# Patient Record
Sex: Female | Born: 1975 | Race: White | Hispanic: No | Marital: Married | Smoking: Former smoker
Health system: Southern US, Community
[De-identification: ages and names within clinical notes are randomized; demographics above are authoritative.]

## PROBLEM LIST (undated history)

## (undated) DIAGNOSIS — C649 Malignant neoplasm of unspecified kidney, except renal pelvis: Secondary | ICD-10-CM

## (undated) DIAGNOSIS — E119 Type 2 diabetes mellitus without complications: Secondary | ICD-10-CM

## (undated) HISTORY — PX: ABDOMINAL HYSTERECTOMY: SHX81

## (undated) HISTORY — PX: CHOLECYSTECTOMY: SHX55

## (undated) HISTORY — PX: BREAST CYST ASPIRATION: SHX578

## (undated) HISTORY — PX: TONSILLECTOMY: SUR1361

## (undated) HISTORY — PX: NEPHRECTOMY: SHX65

---

## 2012-05-04 ENCOUNTER — Emergency Department (HOSPITAL_COMMUNITY)
Admission: EM | Admit: 2012-05-04 | Discharge: 2012-05-04 | Disposition: A | Discharge status: Home / Self Care | Payer: Self-pay | Attending: Emergency Medicine | Admitting: Emergency Medicine

## 2012-05-04 ENCOUNTER — Encounter (HOSPITAL_COMMUNITY): Payer: Self-pay | Admitting: Emergency Medicine

## 2012-05-04 ENCOUNTER — Emergency Department (HOSPITAL_COMMUNITY): Payer: Self-pay

## 2012-05-04 DIAGNOSIS — Z79899 Other long term (current) drug therapy: Secondary | ICD-10-CM | POA: Insufficient documentation

## 2012-05-04 DIAGNOSIS — R6883 Chills (without fever): Secondary | ICD-10-CM | POA: Insufficient documentation

## 2012-05-04 DIAGNOSIS — K5900 Constipation, unspecified: Secondary | ICD-10-CM | POA: Insufficient documentation

## 2012-05-04 DIAGNOSIS — R630 Anorexia: Secondary | ICD-10-CM | POA: Insufficient documentation

## 2012-05-04 DIAGNOSIS — Z8719 Personal history of other diseases of the digestive system: Secondary | ICD-10-CM | POA: Insufficient documentation

## 2012-05-04 DIAGNOSIS — R1011 Right upper quadrant pain: Secondary | ICD-10-CM | POA: Insufficient documentation

## 2012-05-04 DIAGNOSIS — M549 Dorsalgia, unspecified: Secondary | ICD-10-CM | POA: Insufficient documentation

## 2012-05-04 DIAGNOSIS — R109 Unspecified abdominal pain: Secondary | ICD-10-CM

## 2012-05-04 DIAGNOSIS — Z85528 Personal history of other malignant neoplasm of kidney: Secondary | ICD-10-CM | POA: Insufficient documentation

## 2012-05-04 DIAGNOSIS — Z90710 Acquired absence of both cervix and uterus: Secondary | ICD-10-CM | POA: Insufficient documentation

## 2012-05-04 DIAGNOSIS — E119 Type 2 diabetes mellitus without complications: Secondary | ICD-10-CM | POA: Insufficient documentation

## 2012-05-04 DIAGNOSIS — R110 Nausea: Secondary | ICD-10-CM | POA: Insufficient documentation

## 2012-05-04 DIAGNOSIS — Z905 Acquired absence of kidney: Secondary | ICD-10-CM | POA: Insufficient documentation

## 2012-05-04 HISTORY — DX: Malignant neoplasm of unspecified kidney, except renal pelvis: C64.9

## 2012-05-04 HISTORY — DX: Type 2 diabetes mellitus without complications: E11.9

## 2012-05-04 LAB — CBC WITH DIFFERENTIAL/PLATELET
Eosinophils Relative: 1 % (ref 0–5)
HCT: 38 % (ref 36.0–46.0)
Hemoglobin: 13 g/dL (ref 12.0–15.0)
Lymphocytes Relative: 34 % (ref 12–46)
Lymphs Abs: 2.8 10*3/uL (ref 0.7–4.0)
MCH: 27.4 pg (ref 26.0–34.0)
MCV: 80.2 fL (ref 78.0–100.0)
Monocytes Absolute: 0.7 10*3/uL (ref 0.1–1.0)
Monocytes Relative: 8 % (ref 3–12)
Platelets: 317 10*3/uL (ref 150–400)
RBC: 4.74 MIL/uL (ref 3.87–5.11)
WBC: 8.2 10*3/uL (ref 4.0–10.5)

## 2012-05-04 LAB — LIPASE, BLOOD: Lipase: 53 U/L (ref 11–59)

## 2012-05-04 LAB — URINALYSIS, ROUTINE W REFLEX MICROSCOPIC
Bilirubin Urine: NEGATIVE
Hgb urine dipstick: NEGATIVE
Protein, ur: NEGATIVE mg/dL
Urobilinogen, UA: 0.2 mg/dL (ref 0.0–1.0)

## 2012-05-04 LAB — COMPREHENSIVE METABOLIC PANEL
ALT: 19 U/L (ref 0–35)
BUN: 14 mg/dL (ref 6–23)
CO2: 23 mEq/L (ref 19–32)
Calcium: 9.6 mg/dL (ref 8.4–10.5)
GFR calc Af Amer: >90 mL/min (ref >90)
GFR calc non Af Amer: >90 mL/min (ref >90)
Glucose, Bld: 116 mg/dL — ABNORMAL HIGH (ref 70–99)
Sodium: 137 mEq/L (ref 135–145)

## 2012-05-04 LAB — D-DIMER, QUANTITATIVE: D-Dimer, Quant: <0.27 ug/mL-FEU (ref 0.00–0.48)

## 2012-05-04 MED ORDER — IOHEXOL 300 MG/ML  SOLN
100.0000 mL | Freq: Once | INTRAMUSCULAR | Status: AC | PRN
  Administered 2012-05-04: 100 mL via INTRAVENOUS

## 2012-05-04 MED ORDER — POLYETHYLENE GLYCOL 3350 17 G PO PACK: 17.0000 g | PACK | Freq: Every day | ORAL | Status: DC

## 2012-05-04 MED ORDER — IOHEXOL 300 MG/ML  SOLN
25.0000 mL | INTRAMUSCULAR | Status: AC
  Administered 2012-05-04: 25 mL via ORAL

## 2012-05-04 MED ORDER — SODIUM CHLORIDE 0.9 % IV SOLN: Freq: Once | INTRAVENOUS | Status: DC

## 2012-05-04 MED ORDER — ONDANSETRON HCL 4 MG PO TABS: 4.0000 mg | ORAL_TABLET | Freq: Four times a day (QID) | ORAL | Status: DC

## 2012-05-04 MED ORDER — SODIUM CHLORIDE 0.9 % IV BOLUS (SEPSIS)
1000.0000 mL | Freq: Once | INTRAVENOUS | Status: AC
  Administered 2012-05-04: 1000 mL via INTRAVENOUS

## 2012-05-04 MED ORDER — MORPHINE SULFATE 4 MG/ML IJ SOLN
4.0000 mg | Freq: Once | INTRAMUSCULAR | Status: AC
  Administered 2012-05-04: 4 mg via INTRAVENOUS
  Filled 2012-05-04: qty 1

## 2012-05-04 MED ORDER — ONDANSETRON HCL 4 MG/2ML IJ SOLN
4.0000 mg | Freq: Once | INTRAMUSCULAR | Status: AC
  Administered 2012-05-04: 4 mg via INTRAVENOUS
  Filled 2012-05-04: qty 2

## 2012-05-04 NOTE — ED Notes (Signed)
Pt undressed, in gown; family at bedside

## 2012-05-04 NOTE — ED Provider Notes (Signed)
History     CSN: [id number]  Arrival date & time 05/04/12  4098   First MD Initiated Contact with Patient 05/04/12 0740      Chief Complaint  Patient presents with  . Abdominal Pain    (Consider location/radiation/quality/duration/timing/severity/associated sxs/prior treatment) HPI Comments: Patient presents with right sided abdominal pain that started last night. She has had it intermittently past several months but getting worse over the past day.  The pain is in her right upper quadrant and worse with palpation and worse with eating. It radiates to her right back. There is associated nausea and chills. She denies vomiting, fever, dysuria, hematuria, vaginal bleeding or discharge. She was told about 6 months ago that she had an inflamed gallbladder on CT scan. She has a history of renal cancer status post nephrectomy.   The history is provided by the patient.    Past Medical History  Diagnosis Date  . Renal cancer   . Diabetes mellitus without complication     Past Surgical History  Procedure Laterality Date  . Nephrectomy    . Abdominal hysterectomy      No family history on file.  History  Substance Use Topics  . Smoking status: Never Smoker   . Smokeless tobacco: Not on file  . Alcohol Use: No    OB History   Grav Para Term Preterm Abortions TAB SAB Ect Mult Living                  Review of Systems  Constitutional: Positive for chills, activity change and appetite change. Negative for fever and fatigue.  HENT: Negative for rhinorrhea.   Respiratory: Negative for cough, chest tightness and shortness of breath.   Cardiovascular: Negative for chest pain.  Gastrointestinal: Positive for nausea and abdominal pain. Negative for vomiting and diarrhea.  Genitourinary: Negative for dysuria, hematuria, vaginal bleeding and vaginal discharge.  Musculoskeletal: Positive for back pain.  Skin: Negative for rash.  Neurological: Negative for dizziness, weakness and  headaches.  A complete 10 system review of systems was obtained and all systems are negative except as noted in the HPI and PMH.    Allergies  Ibuprofen; Penicillins; and Sulfa antibiotics  Home Medications   Current Outpatient Rx  Name  Route  Sig  Dispense  Refill  . estradiol (ESTRACE) 1 MG tablet   Oral   Take 1 mg by mouth daily.         Marland Kitchen gabapentin (NEURONTIN) 100 MG capsule   Oral   Take 100 mg by mouth 3 (three) times daily.         Marland Kitchen glimepiride (AMARYL) 4 MG tablet   Oral   Take 4 mg by mouth daily before breakfast.         . metFORMIN (GLUCOPHAGE) 500 MG tablet   Oral   Take 500 mg by mouth 2 (two) times daily with a meal.         . metoprolol (LOPRESSOR) 100 MG tablet   Oral   Take 100 mg by mouth 2 (two) times daily.         . Naproxen Sodium (ALEVE PO)   Oral   Take 1-2 tablets by mouth every 12 (twelve) hours as needed (headache or pain).         . ondansetron (ZOFRAN) 4 MG tablet   Oral   Take 1 tablet (4 mg total) by mouth every 6 (six) hours.   12 tablet   0   .  polyethylene glycol (MIRALAX) packet   Oral   Take 17 g by mouth daily.   14 each   0     BP 134/79  Pulse 89  Temp(Src) 97.9 F (36.6 C) (Oral)  Resp 19  SpO2 98%  Physical Exam  Constitutional: She is oriented to person, place, and time. She appears well-developed and well-nourished. No distress.  HENT:  Head: Normocephalic and atraumatic.  Mouth/Throat: Oropharynx is clear and moist. No oropharyngeal exudate.  Eyes: Conjunctivae and EOM are normal. Pupils are equal, round, and reactive to light.  Neck: Normal range of motion. Neck supple.  Cardiovascular: Normal rate, regular rhythm and normal heart sounds.   Pulmonary/Chest: Effort normal and breath sounds normal. No respiratory distress.  Abdominal: Soft. There is tenderness. There is guarding. There is no rebound.  Right upper quadrant tenderness with guarding.  Musculoskeletal: Normal range of motion. She  exhibits tenderness. She exhibits no edema.  Right flank tenderness, well healed surgical incision  Neurological: She is alert and oriented to person, place, and time. No cranial nerve deficit. She exhibits normal muscle tone. Coordination normal.  Skin: Skin is warm.    ED Course  Procedures (including critical care time)  Labs Reviewed  COMPREHENSIVE METABOLIC PANEL - Abnormal; Notable for the following:    Glucose, Bld 116 (*)    All other components within normal limits  CBC WITH DIFFERENTIAL  LIPASE, BLOOD  URINALYSIS, ROUTINE W REFLEX MICROSCOPIC  D-DIMER, QUANTITATIVE   Dg Chest 2 View  05/04/2012  *RADIOLOGY REPORT*  Clinical Data: Right-sided back pain.  CHEST - 2 VIEW  Comparison: No priors.  Findings: Lung volumes are normal.  No consolidative airspace disease.  No pleural effusions.  No pneumothorax.  No pulmonary nodule or mass noted.  Pulmonary vasculature and the cardiomediastinal silhouette are within normal limits.  IMPRESSION: 1. No radiographic evidence of acute cardiopulmonary disease.   Original Report Authenticated By: Trudie Reed, M.D.    US Abdomen Complete  05/04/2012  *RADIOLOGY REPORT*  Clinical Data:  Right upper quadrant pain.  COMPLETE ABDOMINAL ULTRASOUND  Comparison:  12/28/2011  Findings:  Gallbladder:  No stones or wall thickening.  Negative sonographic Murphy's.  Common bile duct:   Upper limits normal in diameter at 7 mm.  This is stable when compared to prior CT.  Liver:  Increased echotexture compatible with fatty infiltration. No focal abnormality.  No biliary ductal dilatation.  IVC:  Appears normal.  Pancreas:  No focal abnormality seen.  Spleen:  Within normal limits in size and echotexture.  Right Kidney:   Prior right nephrectomy.  Left Kidney:  Mild fullness of the left renal pelvis.  No focal abnormality.  Normal echotexture.  Abdominal aorta:  No aneurysm identified.  IMPRESSION: Prior right nephrectomy.  Slight fullness of the left renal  collecting system.  Increased echotexture throughout the liver compatible with fatty infiltration.   Original Report Authenticated By: Charlett Nose, M.D.    Ct Abdomen Pelvis W Contrast  05/04/2012  *RADIOLOGY REPORT*  Clinical Data: Right upper quadrant pain.  History of renal cell cancer and right nephrectomy.  CT ABDOMEN AND PELVIS WITH CONTRAST  Technique:  Multidetector CT imaging of the abdomen and pelvis was performed following the standard protocol during bolus administration of intravenous contrast.  Contrast: OMNIPAQUE IOHEXOL 300 MG/ML  SOLN  Comparison: 12/28/2011  Findings: Heart is mildly enlarged.  Lung bases are clear.  No effusions.  Diffuse fatty infiltration of the liver with more pronounced focal fatty  infiltration adjacent to the falciform ligament.  No focal abnormality otherwise.  No biliary ductal dilatation.  Gallbladder, stomach, spleen, pancreas, adrenals are unremarkable.  Prior right nephrectomy.  Punctate nonobstructing stone in the lower pole of the left kidney.  No hydronephrosis or focal renal abnormality.  Moderate stool burden throughout the colon.  Small bowel is decompressed.  Appendix is visualized and is normal.  Prior hysterectomy.  No adnexal masses, free fluid or free air.  No adenopathy.  Urinary bladder is grossly unremarkable.  Aorta and iliac vessels are normal caliber.  No acute bony abnormality.  IMPRESSION: Fatty infiltration of the liver.  Heart appears borderline enlarged.  Prior right nephrectomy.  Moderate stool throughout the colon.   Original Report Authenticated By: Charlett Nose, M.D.      1. Abdominal pain   2. Constipation       MDM  Right upper quadrant pain with nausea and chills. Tenderness on exam. We'll obtain labs, ultrasound gallbladder. Treat symptoms.  Urinalysis negative. No white count. LFTs and lipase normal. Gallbladder appears normal on ultrasound. We'll obtain CT imaging to investigate further.  CT scan shows no acute  pathology. Moderate stool. Patient's workup is unremarkable and she is tolerating by mouth. her pain is controlled. Instructed to followup with her transplant surgeon regarding her ongoing pain.   Glynn Octave, MD 05/04/12 [phone]

## 2012-05-04 NOTE — ED Notes (Addendum)
Pt c/o RUQ pain that radiates into back over the last week. Pt started with nausea this AM and increased pain. Pt DM2 and takes oral medications for this, PMH of renal cancer with right nephrectomy. NAD at this time, speaking full sentences

## 2012-05-04 NOTE — ED Notes (Signed)
Pt is drinking contrast at this time, 1 cup down and 1 to go

## 2015-01-29 IMAGING — US US ABDOMEN COMPLETE
1 series · 14 of 25 positions shown · non-contrast
Comparison: 12/28/2011

CLINICAL DATA: Right upper quadrant pain.

COMPLETE ABDOMINAL ULTRASOUND

[Series 1: us abdomen complete · 0.35mm/px · 14 of 57 slices shown]
[im 1/57]
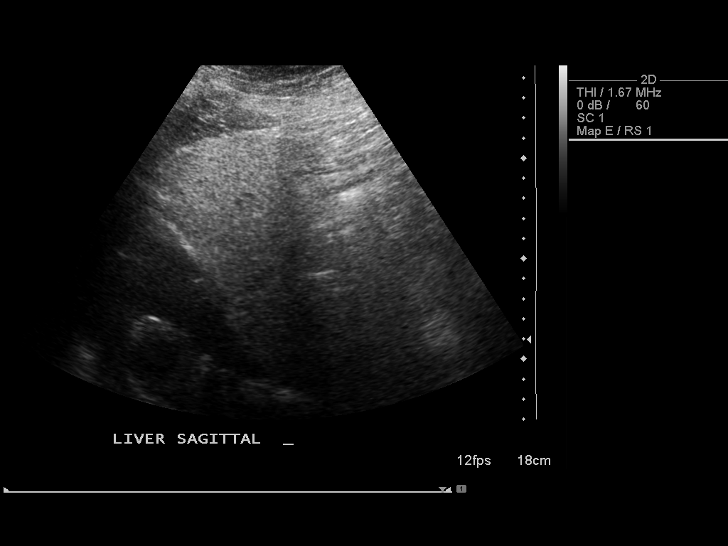
[im 5/57]
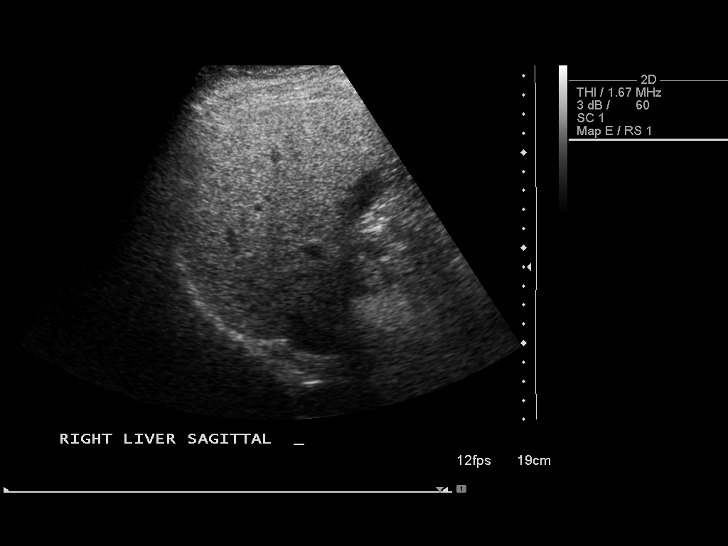
[im 10/57]
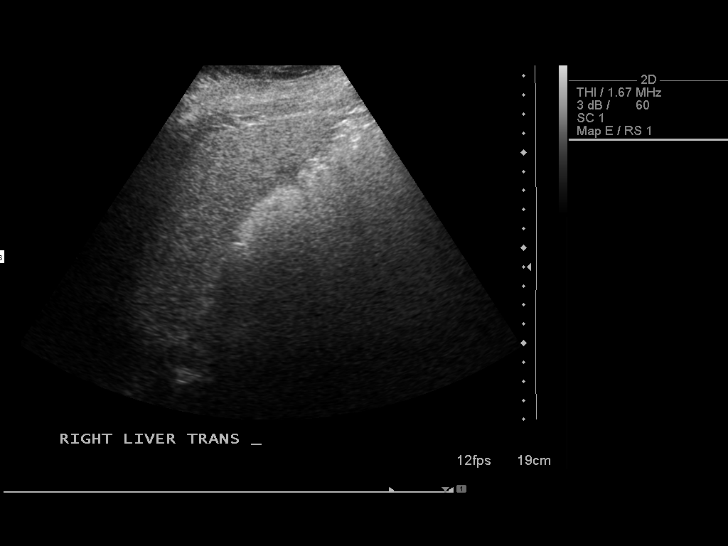
[im 15/57]
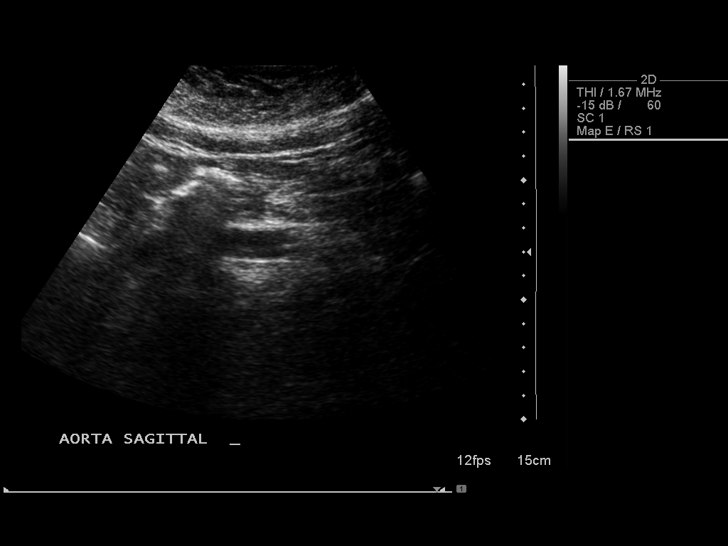
[im 19/57]
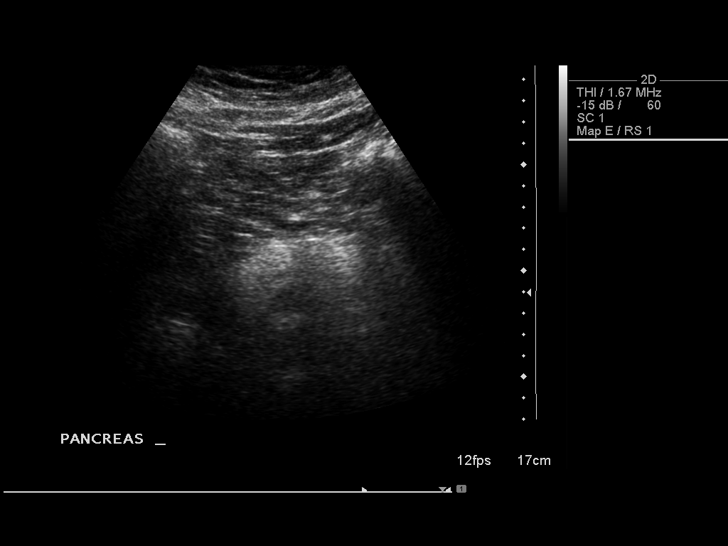
[im 22/57]
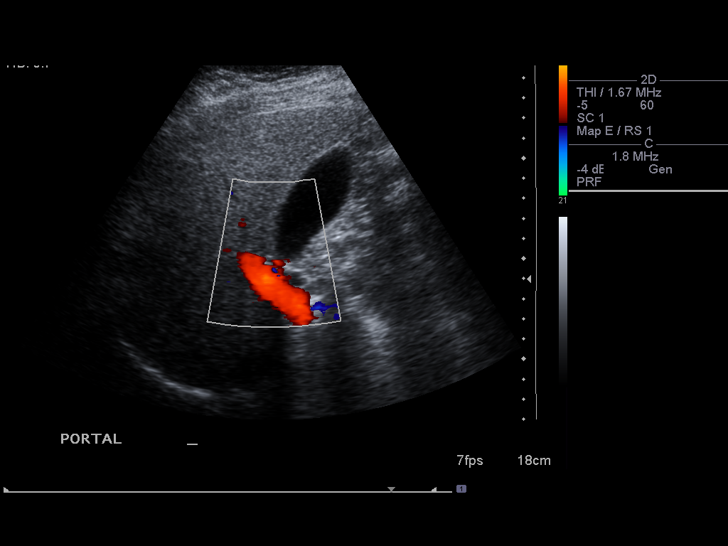
[im 26/57]
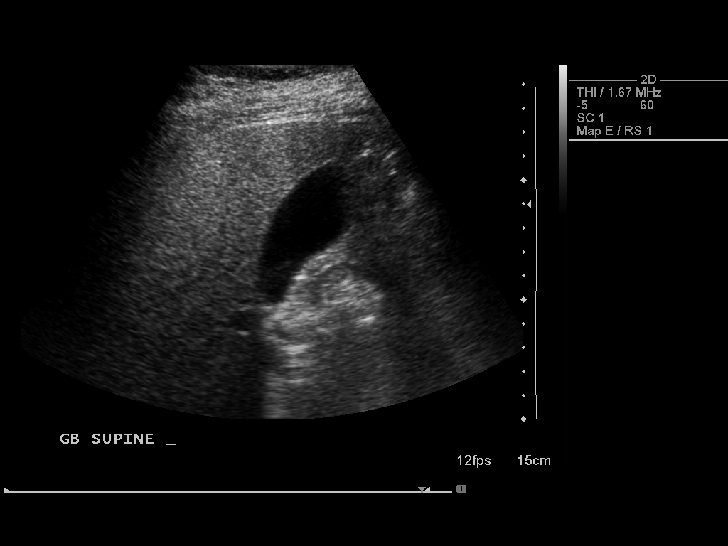
[im 31/57]
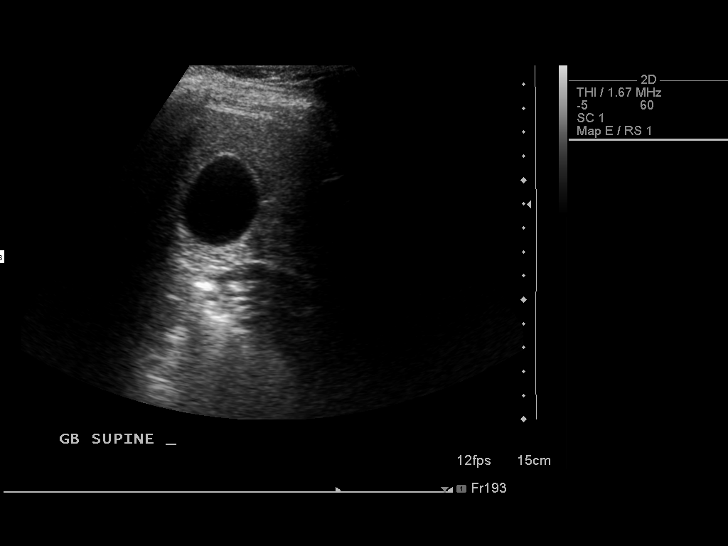
[im 36/57]
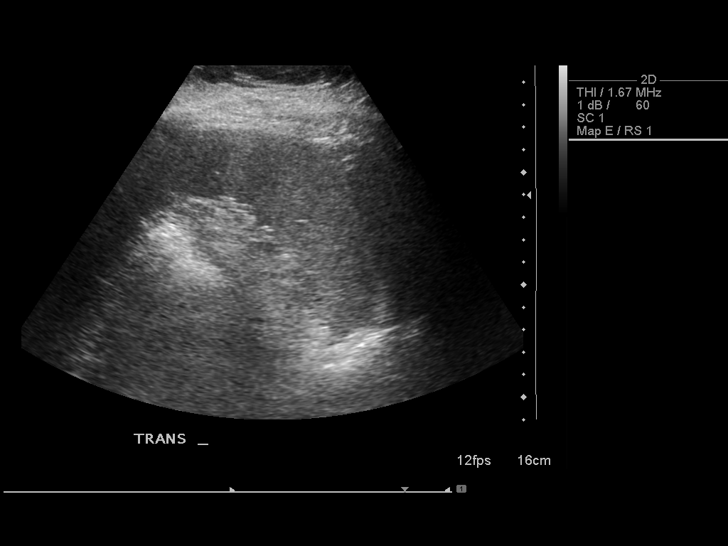
[im 38/57]
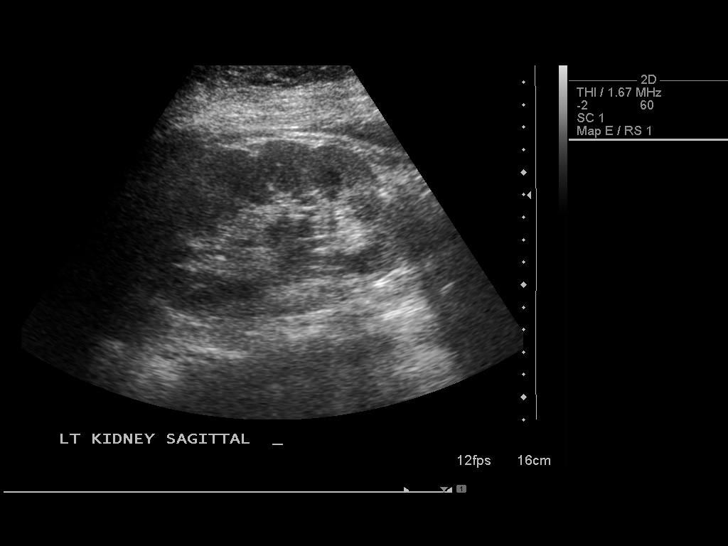
[im 43/57]
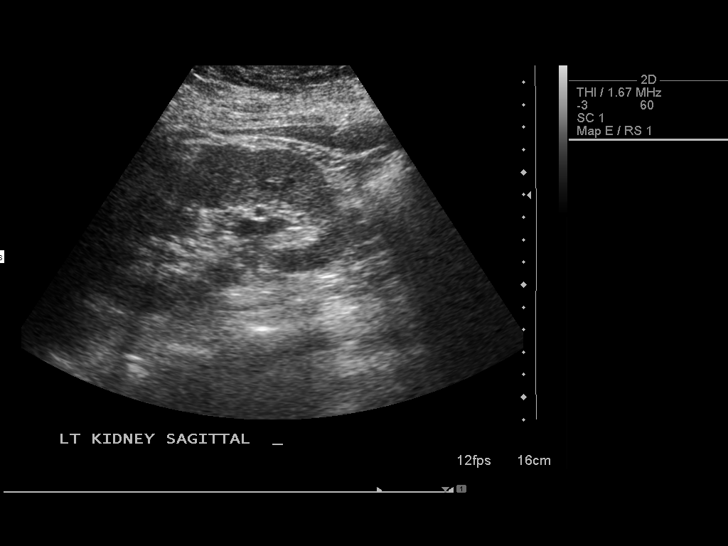
[im 47/57]
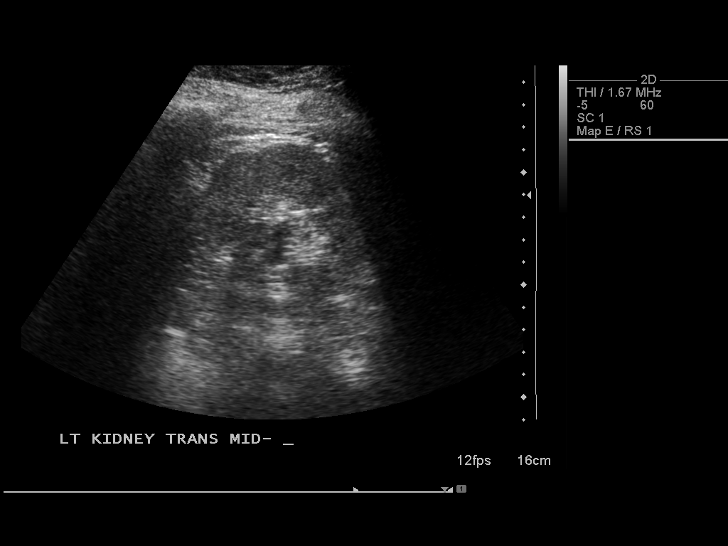
[im 52/57]
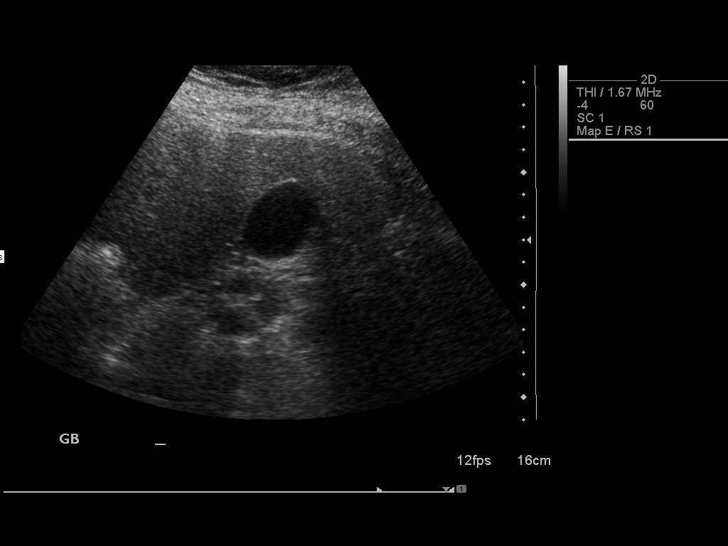
[im 57/57]
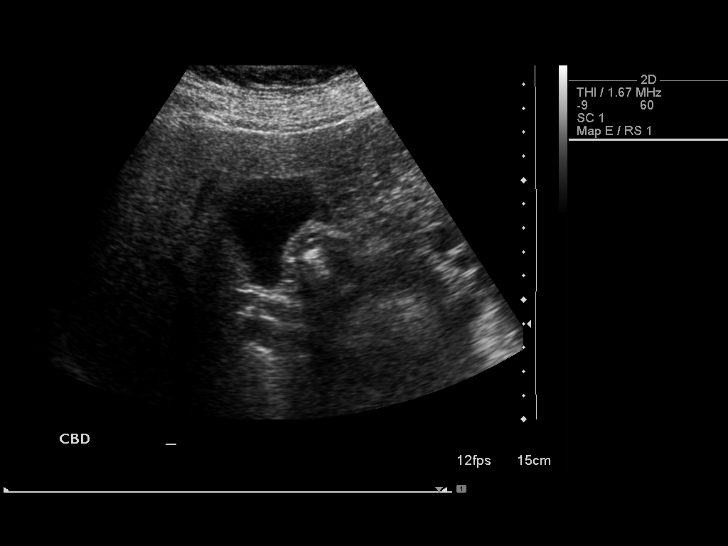

[14 of 25 positions shown; findings below may reference images not displayed]

FINDINGS: Gallbladder:  No stones or wall thickening.  Negative sonographic
Matthei.

Common bile duct:   Upper limits normal in diameter at 7 mm.  This
is stable when compared to prior CT.

Liver:  Increased echotexture compatible with fatty infiltration.
No focal abnormality.  No biliary ductal dilatation.

IVC:  Appears normal.

Pancreas:  No focal abnormality seen.

Spleen:  Within normal limits in size and echotexture.

Right Kidney:   Prior right nephrectomy.

Left Kidney:  Mild fullness of the left renal pelvis.  No focal
abnormality.  Normal echotexture.

Abdominal aorta:  No aneurysm identified.
IMPRESSION: Prior right nephrectomy.  Slight fullness of the left renal
collecting system.

Increased echotexture throughout the liver compatible with fatty
infiltration.

## 2015-01-29 IMAGING — CT CT ABD-PELV W/ CM
2 of 4 series · 13 of 32 positions shown, 18 images · IV contrast (omnipaque)
Comparison: 12/28/2011

CLINICAL DATA: Right upper quadrant pain.  History of renal cell
cancer and right nephrectomy.

CT ABDOMEN AND PELVIS WITH CONTRAST
TECHNIQUE: Multidetector CT imaging of the abdomen and pelvis was
performed following the standard protocol during bolus
administration of intravenous contrast.
Contrast: 100mL OMNIPAQUE IOHEXOL 300 MG/ML  SOLN

[Series 2: routine abdomen · axial · 0.76mm/px · z∈[-428,-103]mm · 5 of 97 slices shown, 10 images]
[im 17/97  soft-tissue]
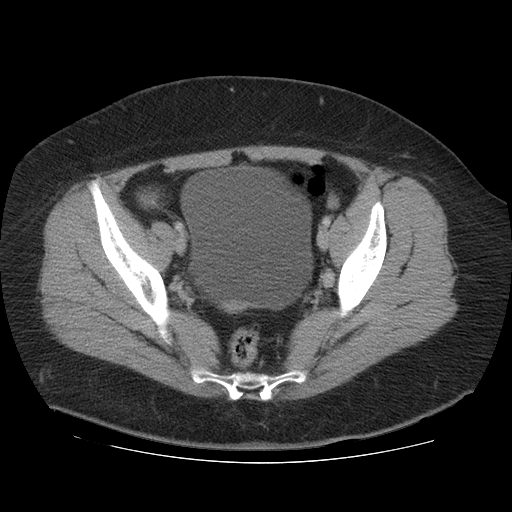
[im 17/97  bone]
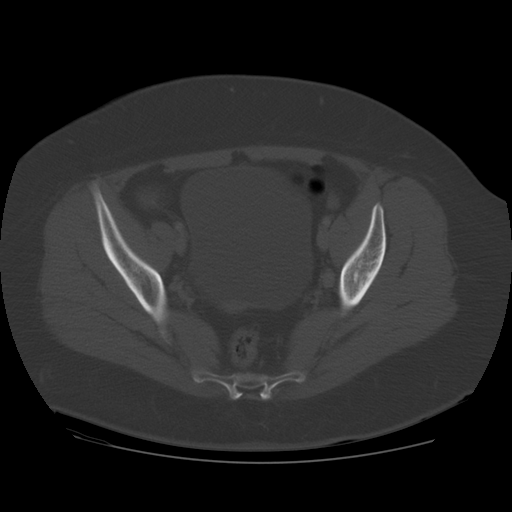
[im 33/97  soft-tissue]
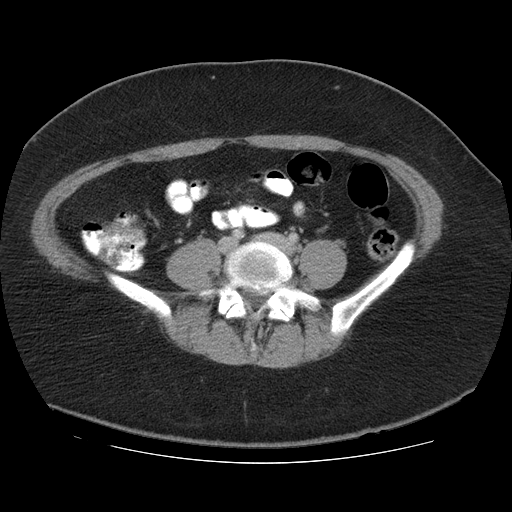
[im 33/97  lung]
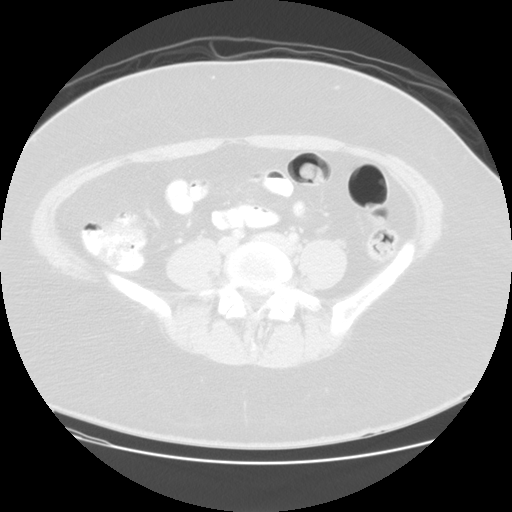
[im 49/97  soft-tissue]
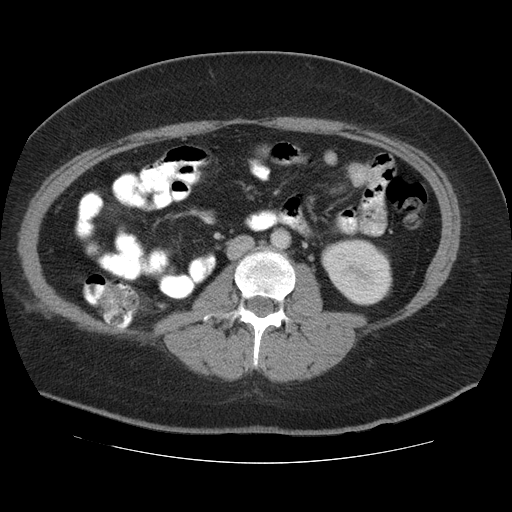
[im 49/97  lung]
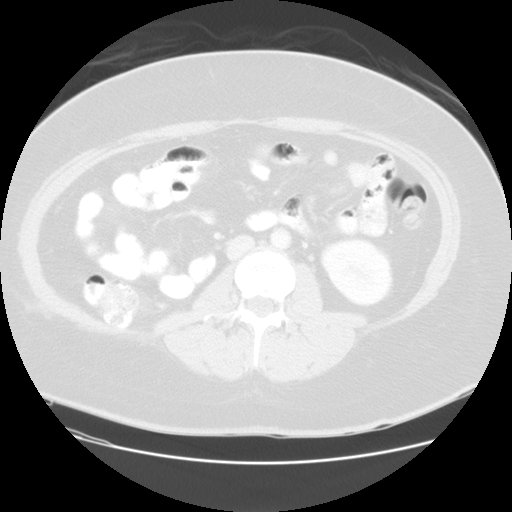
[im 65/97  soft-tissue]
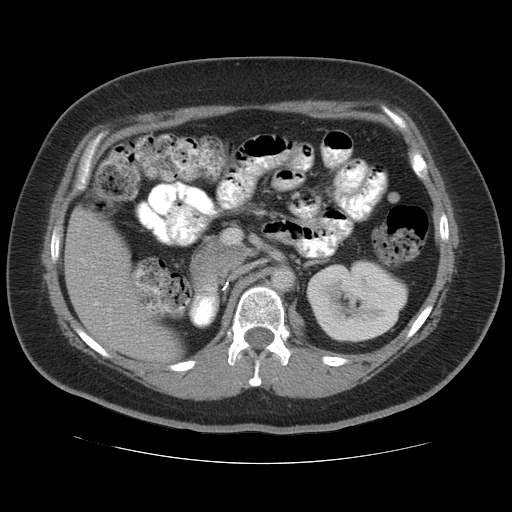
[im 65/97  lung]
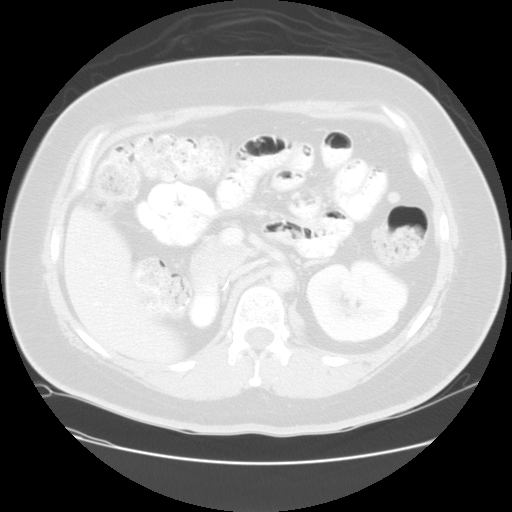
[im 81/97  soft-tissue]
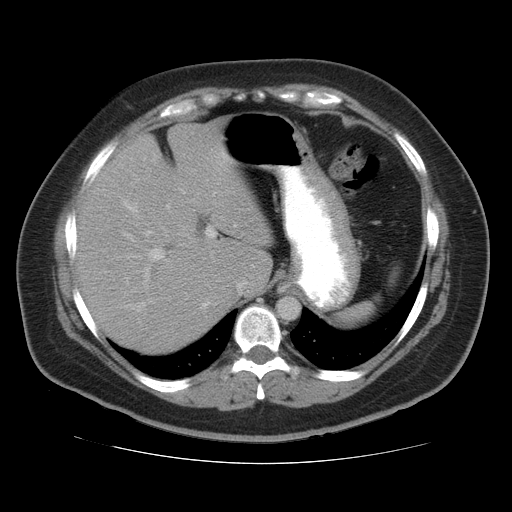
[im 81/97  lung]
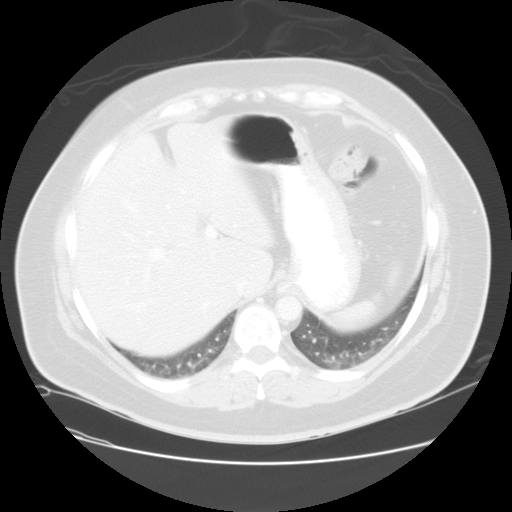

[Series 401: sagittals · sagittal · 1.04mm/px · 8 of 186 slices shown]
[im 16/186  soft-tissue]
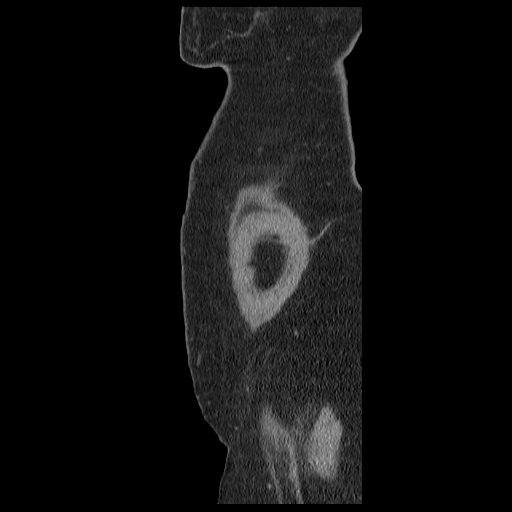
[im 47/186  soft-tissue]
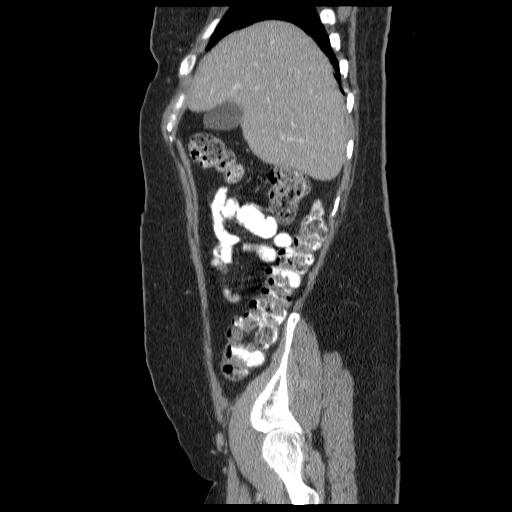
[im 62/186  soft-tissue]
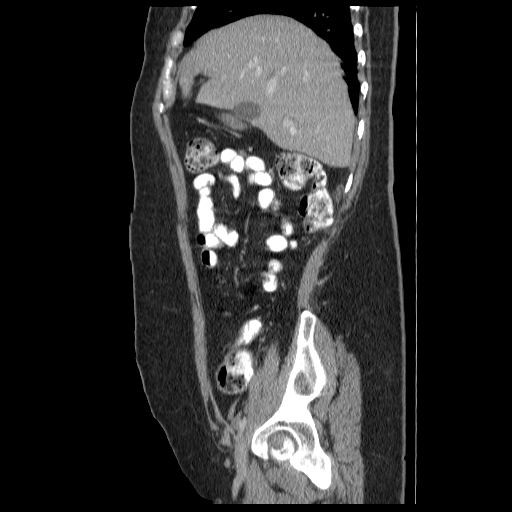
[im 78/186  soft-tissue]
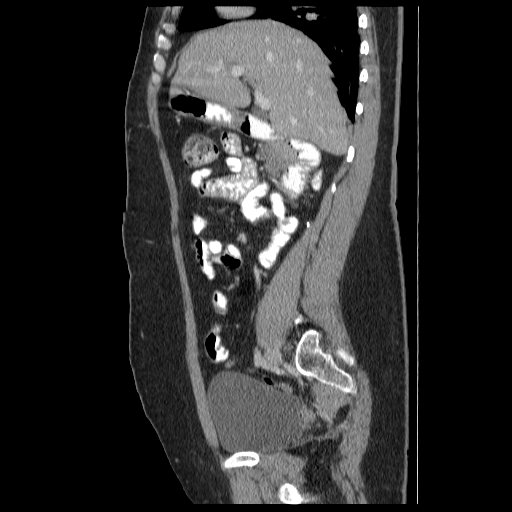
[im 108/186  soft-tissue]
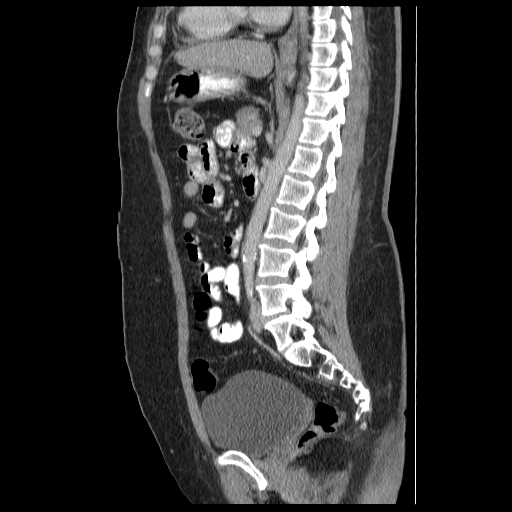
[im 124/186  soft-tissue]
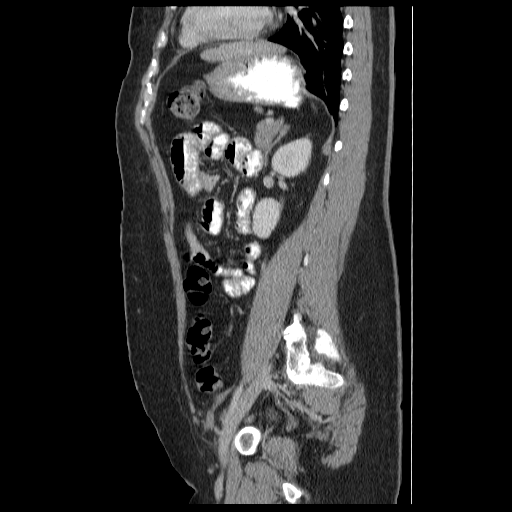
[im 139/186  soft-tissue]
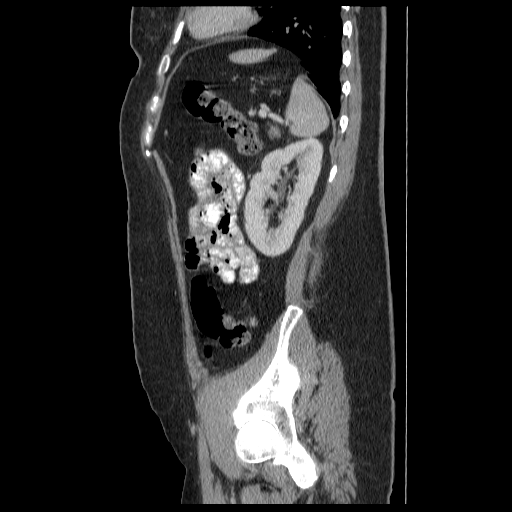
[im 170/186  soft-tissue]
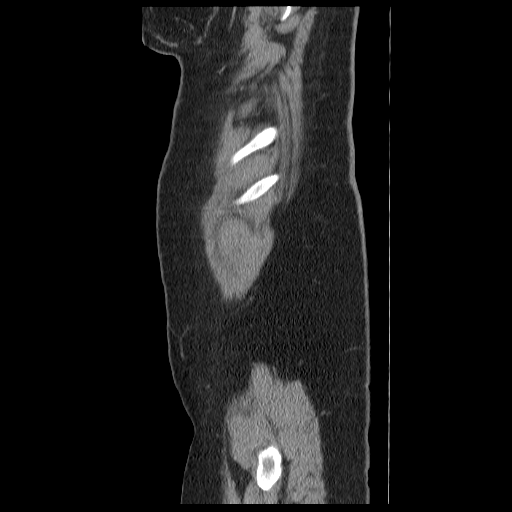

[13 of 32 positions shown; findings below may reference images not displayed]

FINDINGS: Heart is mildly enlarged.  Lung bases are clear.  No
effusions.

Diffuse fatty infiltration of the liver with more pronounced focal
fatty infiltration adjacent to the falciform ligament.  No focal
abnormality otherwise.  No biliary ductal dilatation.  Gallbladder,
stomach, spleen, pancreas, adrenals are unremarkable.  Prior right
nephrectomy.  Punctate nonobstructing stone in the lower pole of
the left kidney.  No hydronephrosis or focal renal abnormality.

Moderate stool burden throughout the colon.  Small bowel is
decompressed.  Appendix is visualized and is normal.

Prior hysterectomy.  No adnexal masses, free fluid or free air.  No
adenopathy.  Urinary bladder is grossly unremarkable.

Aorta and iliac vessels are normal caliber.  No acute bony
abnormality.
IMPRESSION: Fatty infiltration of the liver.

Heart appears borderline enlarged.

Prior right nephrectomy.

Moderate stool throughout the colon.

## 2016-07-01 DIAGNOSIS — L723 Sebaceous cyst: Secondary | ICD-10-CM

## 2016-07-01 HISTORY — DX: Sebaceous cyst: L72.3

## 2018-02-20 DIAGNOSIS — M722 Plantar fascial fibromatosis: Secondary | ICD-10-CM

## 2018-02-20 HISTORY — DX: Plantar fascial fibromatosis: M72.2

## 2020-12-02 ENCOUNTER — Encounter: Payer: Self-pay | Admitting: Physician Assistant

## 2020-12-02 ENCOUNTER — Other Ambulatory Visit: Payer: Self-pay

## 2020-12-02 ENCOUNTER — Ambulatory Visit: Payer: BC Managed Care – PPO | Admitting: Physician Assistant

## 2020-12-02 VITALS — BP 138/76 | HR 88 | Temp 98.6°F | Resp 18 | Ht 64.0 in | Wt 222.0 lb

## 2020-12-02 DIAGNOSIS — M25551 Pain in right hip: Secondary | ICD-10-CM

## 2020-12-02 DIAGNOSIS — E1029 Type 1 diabetes mellitus with other diabetic kidney complication: Secondary | ICD-10-CM

## 2020-12-02 DIAGNOSIS — K219 Gastro-esophageal reflux disease without esophagitis: Secondary | ICD-10-CM

## 2020-12-02 DIAGNOSIS — Z85528 Personal history of other malignant neoplasm of kidney: Secondary | ICD-10-CM

## 2020-12-02 DIAGNOSIS — G2581 Restless legs syndrome: Secondary | ICD-10-CM

## 2020-12-02 DIAGNOSIS — I10 Essential (primary) hypertension: Secondary | ICD-10-CM

## 2020-12-02 DIAGNOSIS — G8929 Other chronic pain: Secondary | ICD-10-CM

## 2020-12-02 DIAGNOSIS — Z1211 Encounter for screening for malignant neoplasm of colon: Secondary | ICD-10-CM

## 2020-12-02 DIAGNOSIS — E109 Type 1 diabetes mellitus without complications: Secondary | ICD-10-CM | POA: Insufficient documentation

## 2020-12-02 HISTORY — DX: Essential (primary) hypertension: I10

## 2020-12-02 HISTORY — DX: Gastro-esophageal reflux disease without esophagitis: K21.9

## 2020-12-02 HISTORY — DX: Personal history of other malignant neoplasm of kidney: Z85.528

## 2020-12-02 HISTORY — DX: Other chronic pain: G89.29

## 2020-12-02 HISTORY — DX: Restless legs syndrome: G25.81

## 2020-12-02 HISTORY — DX: Encounter for screening for malignant neoplasm of colon: Z12.11

## 2020-12-02 MED ORDER — ROPINIROLE HCL 2 MG PO TABS: 2.0000 mg | ORAL_TABLET | Freq: Every day | ORAL | 2 refills | Status: DC

## 2020-12-02 NOTE — Progress Notes (Signed)
New Patient Office Visit  Subjective:  Patient ID: Kelly Wilkins, female    DOB: [birth year]  Age: 45 y.o. MRN: [id number]  CC:  Chief Complaint  Patient presents with   Diabetes    HPI Kelly Wilkins presents for diabetes - pt states that she has had for about 10 years - states currently she is on rybelsus, glucophage, and farxiga - cannot remember last a1c but does know she is due for labwork next month - voices no cocnerns or problems today  Pt with history of neuralgia - she takes neurontin 100mg  5 times daily - this problem was caused after she had her right kidney removed for history of renal cancer in 2012  Pt with history of hypertension - currently on amlodopine and  metoprolol - states bp has been doing well and voices no history of chest pain or dyspnea  Pt with history of restless leg syndrome - currently on Requip 1 mg - states it helps some but not completely - has trouble with leg movement and with trouble sleeping  Pt with history of chronic right low back and hip pain - cannot recall specific history of injury or trauma - has pain intermittently in low back that radiates to hip and down right leg - would like referral to ortho for further evaluation and treatment  Pt with history of GERD - currently stable on omeprazole 40mg  qd  Past Medical History:  Diagnosis Date   Diabetes mellitus without complication ([place])    Renal cancer ([place])     Past Surgical History:  Procedure Laterality Date   ABDOMINAL HYSTERECTOMY     CHOLECYSTECTOMY     NEPHRECTOMY     TONSILLECTOMY      Family History  Problem Relation Age of Onset   Hypertension Maternal Grandmother    Diabetes Maternal Grandmother    Hypertension Maternal Grandfather    Diabetes Maternal Grandfather    Hypertension Paternal Grandmother    Diabetes Paternal Grandmother    Hypertension Paternal Grandfather     Social History   Socioeconomic History   Marital status: Married    Spouse name: Not on  file   Number of children: Not on file   Years of education: Not on file   Highest education level: Not on file  Occupational History   Not on file  Tobacco Use   Smoking status: Former    Years: 10.00    Types: Cigarettes   Smokeless tobacco: Not on file   Tobacco comments:    Stopped 12 years ago  Substance and Sexual Activity   Alcohol use: Yes    Comment: occasional   Drug use: No   Sexual activity: Not on file  Other Topics Concern   Not on file  Social History Narrative   Not on file   Social Determinants of Health   Financial Resource Strain: Not on file  Food Insecurity: Not on file  Transportation Needs: Not on file  Physical Activity: Not on file  Stress: Not on file  Social Connections: Not on file  Intimate Partner Violence: Not on file     Current Outpatient Medications:    amLODipine (NORVASC) 5 MG tablet, Take 5 mg by mouth daily., Disp: , Rfl:    dapagliflozin propanediol (FARXIGA) 10 MG TABS tablet, , Disp: , Rfl:    gabapentin (NEURONTIN) 100 MG capsule, Take 100 mg by mouth 5 (five) times daily., Disp: , Rfl:    metFORMIN (GLUCOPHAGE) 850 MG tablet, Take  850 mg by mouth 2 (two) times daily with a meal., Disp: , Rfl:    metoprolol (LOPRESSOR) 100 MG tablet, Take 100 mg by mouth 2 (two) times daily., Disp: , Rfl:    rOPINIRole (REQUIP) 2 MG tablet, Take 1 tablet (2 mg total) by mouth at bedtime., Disp: 30 tablet, Rfl: 2   Semaglutide (RYBELSUS) 7 MG TABS, TAKE 1 TABLET BY MOUTH IN THE MORNING -  30  MINUTES  BEFORE  OTHER  MEDS  OR  FOOD, Disp: , Rfl:    Allergies  Allergen Reactions   Ibuprofen Other (See Comments)    Due to history of kidney cancer Other reaction(s): Other (See Comments) Due to history of kidney cancer   Codeine    Penicillins Nausea And Vomiting and Rash   Sulfa Antibiotics Nausea And Vomiting and Rash    ROS CONSTITUTIONAL: Negative for chills, fatigue, fever, unintentional weight gain and unintentional weight loss.   E/N/T: Negative for ear pain, nasal congestion and sore throat.  CARDIOVASCULAR: Negative for chest pain, dizziness, palpitations and pedal edema.  RESPIRATORY: Negative for recent cough and dyspnea.  GASTROINTESTINAL: Negative for abdominal pain, acid reflux symptoms, constipation, diarrhea, nausea and vomiting.  MSK: see HPI INTEGUMENTARY: Negative for rash.  NEUROLOGICAL: Negative for dizziness and headaches.  PSYCHIATRIC: see HPI       Objective:    PHYSICAL EXAM:   VS: BP 138/76   Pulse 88   Temp 98.6 F (37 C)   Resp 18   Ht 5\' 4"  (8.756 m)   Wt 222 lb (100.7 kg)   SpO2 97%   BMI 38.11 kg/m   GEN: Well nourished, well developed, in no acute distress  Cardiac: RRR; no murmurs, rubs, or gallops,no edema - no significant varicosities Respiratory:  normal respiratory rate and pattern with no distress - normal breath sounds with no rales, rhonchi, wheezes or rubs MS: no deformity or atrophy  Skin: warm and dry, no rash  Psych: euthymic mood, appropriate affect and demeanor  BP 138/76   Pulse 88   Temp 98.6 F (37 C)   Resp 18   Ht 5\' 4"  (1.626 m)   Wt 222 lb (100.7 kg)   SpO2 97%   BMI 38.11 kg/m  Wt Readings from Last 3 Encounters:  12/02/20 222 lb (100.7 kg)     Health Maintenance Due  Topic Date Due   HEMOGLOBIN A1C  Never done   FOOT EXAM  Never done   OPHTHALMOLOGY EXAM  Never done   URINE MICROALBUMIN  Never done   HIV Screening  Never done   Hepatitis C Screening  Never done   TETANUS/TDAP  Never done   COLONOSCOPY (Pts 45-90yrs Insurance coverage will need to be confirmed)  Never done    There are no preventive care reminders to display for this patient.  No results found for: TSH Lab Results  Component Value Date   WBC 8.2 05/04/2012   HGB 13.0 05/04/2012   HCT 38.0 05/04/2012   MCV 80.2 05/04/2012   PLT 317 05/04/2012   Lab Results  Component Value Date   NA 137 05/04/2012   K 4.2 05/04/2012   CO2 23 05/04/2012   GLUCOSE 116 (H)  05/04/2012   BUN 14 05/04/2012   CREATININE 0.75 05/04/2012   BILITOT 0.3 05/04/2012   ALKPHOS 76 05/04/2012   AST 21 05/04/2012   ALT 19 05/04/2012   PROT 7.6 05/04/2012   ALBUMIN 3.8 05/04/2012   CALCIUM 9.6 05/04/2012  No results found for: CHOL No results found for: HDL No results found for: LDLCALC No results found for: TRIG No results found for: CHOLHDL No results found for: HGBA1C    Assessment & Plan:   Problem List Items Addressed This Visit       Cardiovascular and Mediastinum   Benign hypertension   Relevant Medications   amLODipine (NORVASC) 5 MG tablet Continue current meds     Digestive   Gastroesophageal reflux disease without esophagitis Continue meds     Endocrine   Diabetes mellitus type I ([place]) - Primary   Relevant Medications   dapagliflozin propanediol (FARXIGA) 10 MG TABS tablet   metFORMIN (GLUCOPHAGE) 850 MG tablet   Semaglutide (RYBELSUS) 7 MG TABS     Other   Chronic right hip pain   Relevant Orders   Ambulatory referral to Orthopedic Surgery   Restless leg syndrome Increase requip     History of kidney cancer    Colon cancer screening   Relevant Orders   Ambulatory referral to Gastroenterology    Meds ordered this encounter  Medications   rOPINIRole (REQUIP) 2 MG tablet    Sig: Take 1 tablet (2 mg total) by mouth at bedtime.    Dispense:  30 tablet    Refill:  2    Order Specific Question:   Supervising Provider    AnswerShelton Silvas     Follow-up: Return in about 4 weeks (around 12/30/2020) for FASTING PHYSICAL.    SARA R Ezzie Senat, PA-C

## 2020-12-18 ENCOUNTER — Other Ambulatory Visit: Payer: Self-pay

## 2020-12-18 MED ORDER — DAPAGLIFLOZIN PROPANEDIOL 10 MG PO TABS: 10.0000 mg | ORAL_TABLET | Freq: Every day | ORAL | 0 refills | Status: DC

## 2020-12-18 MED ORDER — RYBELSUS 7 MG PO TABS: ORAL_TABLET | ORAL | 0 refills | Status: DC

## 2020-12-29 ENCOUNTER — Encounter: Payer: BC Managed Care – PPO | Admitting: Physician Assistant

## 2021-01-05 ENCOUNTER — Ambulatory Visit (INDEPENDENT_AMBULATORY_CARE_PROVIDER_SITE_OTHER): Payer: BC Managed Care – PPO | Admitting: Physician Assistant

## 2021-01-05 ENCOUNTER — Other Ambulatory Visit: Payer: Self-pay

## 2021-01-05 ENCOUNTER — Encounter: Payer: Self-pay | Admitting: Physician Assistant

## 2021-01-05 VITALS — BP 128/80 | HR 81 | Temp 95.2°F | Ht 64.0 in | Wt 215.2 lb

## 2021-01-05 DIAGNOSIS — Z23 Encounter for immunization: Secondary | ICD-10-CM

## 2021-01-05 DIAGNOSIS — Z0000 Encounter for general adult medical examination without abnormal findings: Secondary | ICD-10-CM

## 2021-01-05 NOTE — Progress Notes (Signed)
Subjective:  Patient ID: Kelly Wilkins, female    DOB: [birth year]  Age: 45 y.o. MRN: [id number]  Chief Complaint  Patient presents with   Annual Exam    HPI Well Adult Physical: Patient here for a comprehensive physical exam.The patient reports no problems Do you take any herbs or supplements that were not prescribed by a doctor? no Are you taking calcium supplements? no Are you taking aspirin daily? no  Encounter for general adult medical examination without abnormal findings  Physical ("At Risk" items are starred): Patient's last physical exam was 1 year ago .  Patient is not afflicted from Stress Incontinence and Urge Incontinence  Patient wears a seat belt, has smoke detectors, has carbon monoxide detectors, practices appropriate gun safety, and wears sunscreen with extended sun exposure. Dental Care: biannual cleanings, brushes and flosses daily. Ophthalmology/Optometry: Annual visit.  Hearing loss: none Vision impairments: none  Pt has had hysterectomy Due for mammogram  [place] Office Visit from 01/05/2021 in [facility]  PHQ-2 Total Score 0               Social Hx   Social History   Socioeconomic History   Marital status: Married    Spouse name: Not on file   Number of children: Not on file   Years of education: Not on file   Highest education level: Not on file  Occupational History   Not on file  Tobacco Use   Smoking status: Former    Years: 10.00    Types: Cigarettes   Smokeless tobacco: Not on file   Tobacco comments:    Stopped 12 years ago  Substance and Sexual Activity   Alcohol use: Yes    Comment: occasional   Drug use: No   Sexual activity: Not on file  Other Topics Concern   Not on file  Social History Narrative   Not on file   Social Determinants of Health   Financial Resource Strain: Not on file  Food Insecurity: Not on file  Transportation Needs: Not on file  Physical Activity: Not on file  Stress: Not on file   Social Connections: Not on file   Past Medical History:  Diagnosis Date   Diabetes mellitus without complication ([place])    Renal cancer ([place])    Past Surgical History:  Procedure Laterality Date   ABDOMINAL HYSTERECTOMY     CHOLECYSTECTOMY     NEPHRECTOMY     TONSILLECTOMY      Family History  Problem Relation Age of Onset   Hypertension Maternal Grandmother    Diabetes Maternal Grandmother    Hypertension Maternal Grandfather    Diabetes Maternal Grandfather    Hypertension Paternal Grandmother    Diabetes Paternal Grandmother    Hypertension Paternal Grandfather     Review of Systems CONSTITUTIONAL: Negative for chills, fatigue, fever, unintentional weight gain and unintentional weight loss.  E/N/T: Negative for ear pain, nasal congestion and sore throat.  CARDIOVASCULAR: Negative for chest pain, dizziness, palpitations and pedal edema.  RESPIRATORY: Negative for recent cough and dyspnea.  GASTROINTESTINAL: Negative for abdominal pain, acid reflux symptoms, constipation, diarrhea, nausea and vomiting.  MSK: Negative for arthralgias and myalgias.  INTEGUMENTARY: Negative for rash.  NEUROLOGICAL: Negative for dizziness and headaches.  PSYCHIATRIC: Negative for sleep disturbance and to question depression screen.  Negative for depression, negative for anhedonia.       Objective:  BP 128/80 (BP Location: Left Arm, Patient Position: Sitting, Cuff Size: Normal)   Pulse  81   Temp (!) 95.2 F (35.1 C) (Temporal)   Ht 5\' 4"  (1.626 m)   Wt 215 lb 3.2 oz (97.6 kg)   SpO2 96%   BMI 36.94 kg/m   BP/Weight 01/05/2021 12/02/2020 3/84/6659  Systolic BP 935 701 779  Diastolic BP 80 76 79  Wt. (Lbs) 215.2 222 -  BMI 36.94 38.11 -    Physical Exam PHYSICAL EXAM:   VS: BP 128/80 (BP Location: Left Arm, Patient Position: Sitting, Cuff Size: Normal)   Pulse 81   Temp (!) 95.2 F (35.1 C) (Temporal)   Ht 5\' 4"  (1.626 m)   Wt 215 lb 3.2 oz (97.6 kg)   SpO2 96%   BMI 36.94  kg/m   GEN: Well nourished, well developed, in no acute distress  HEENT: normal external ears and nose - normal external auditory canals and TMS - hearing grossly normal - normal nasal mucosa and septum - Lips, Teeth and Gums - normal  Oropharynx - normal mucosa, palate, and posterior pharynx Neck: no JVD or masses - no thyromegaly Cardiac: RRR; no murmurs, rubs, or gallops,no edema - Respiratory:  normal respiratory rate and pattern with no distress - normal breath sounds with no rales, rhonchi, wheezes or rubs GI: normal bowel sounds, no masses or tenderness MS: no deformity or atrophy  Skin: warm and dry, no rash  Neuro:  Alert and Oriented x 3, Strength and sensation are intact - CN II-Xii grossly intact Psych: euthymic mood, appropriate affect and demeanor  Lab Results  Component Value Date   WBC 8.2 05/04/2012   HGB 13.0 05/04/2012   HCT 38.0 05/04/2012   PLT 317 05/04/2012   GLUCOSE 116 (H) 05/04/2012   ALT 19 05/04/2012   AST 21 05/04/2012   NA 137 05/04/2012   K 4.2 05/04/2012   CL 101 05/04/2012   CREATININE 0.75 05/04/2012   BUN 14 05/04/2012   CO2 23 05/04/2012      Assessment & Plan:   Problem List Items Addressed This Visit   None Visit Diagnoses     Annual physical exam    -  Primary   Relevant Orders   CBC with Differential/Platelet   Comprehensive metabolic panel   TSH   Hemoglobin A1c   Lipid panel   Tdap vaccine greater than or equal to 7yo IM (Completed)   MM DIGITAL SCREENING BILATERAL         Body mass index is 36.94 kg/m.   These are the goals we discussed:  Goals   None      This is a list of the screening recommended for you and due dates:  Health Maintenance  Topic Date Due   Hemoglobin A1C  Never done   Eye exam for diabetics  Never done   Urine Protein Check  Never done   Colon Cancer Screening  Never done   Pneumococcal Vaccination (1 - PCV) 01/05/2022*   Complete foot exam   01/05/2022   Tetanus Vaccine  01/06/2031    Flu Shot  Completed   HPV Vaccine  Aged Out   Pap Smear  Discontinued   COVID-19 Vaccine  Discontinued   Hepatitis C Screening: USPSTF Recommendation to screen - Ages 30-79 yo.  Discontinued   HIV Screening  Discontinued  *Topic was postponed. The date shown is not the original due date.     No orders of the defined types were placed in this encounter.   Follow-up: Return in about 3 months (around 04/07/2021)  for chronic fasting follow up.  An After Visit Summary was printed and given to the patient.  Yetta Flock [facility] [phone]

## 2021-01-06 ENCOUNTER — Encounter: Payer: BC Managed Care – PPO | Admitting: Physician Assistant

## 2021-01-06 ENCOUNTER — Other Ambulatory Visit: Payer: Self-pay | Admitting: Physician Assistant

## 2021-01-06 DIAGNOSIS — R849 Unspecified abnormal finding in specimens from respiratory organs and thorax: Secondary | ICD-10-CM

## 2021-01-06 DIAGNOSIS — E782 Mixed hyperlipidemia: Secondary | ICD-10-CM

## 2021-01-06 LAB — COMPREHENSIVE METABOLIC PANEL
ALT: 18 IU/L (ref 0–32)
AST: 21 IU/L (ref 0–40)
Albumin/Globulin Ratio: 2.2 (ref 1.2–2.2)
Albumin: 4.6 g/dL (ref 3.8–4.8)
Alkaline Phosphatase: 97 IU/L (ref 44–121)
BUN/Creatinine Ratio: 20 (ref 9–23)
BUN: 21 mg/dL (ref 6–24)
Bilirubin Total: 0.4 mg/dL (ref 0.0–1.2)
CO2: 22 mmol/L (ref 20–29)
Calcium: 10 mg/dL (ref 8.7–10.2)
Chloride: 102 mmol/L (ref 96–106)
Creatinine, Ser: 1.05 mg/dL — ABNORMAL HIGH (ref 0.57–1.00)
Globulin, Total: 2.1 g/dL (ref 1.5–4.5)
Glucose: 150 mg/dL — ABNORMAL HIGH (ref 70–99)
Potassium: 5 mmol/L (ref 3.5–5.2)
Sodium: 139 mmol/L (ref 134–144)
Total Protein: 6.7 g/dL (ref 6.0–8.5)
eGFR: 66 mL/min/{1.73_m2} (ref >59)

## 2021-01-06 LAB — CBC WITH DIFFERENTIAL/PLATELET
Basophils Absolute: 0.1 10*3/uL (ref 0.0–0.2)
Basos: 1 %
EOS (ABSOLUTE): 0 10*3/uL (ref 0.0–0.4)
Eos: 0 %
Hematocrit: 38.5 % (ref 34.0–46.6)
Hemoglobin: 12.3 g/dL (ref 11.1–15.9)
Immature Grans (Abs): 0 10*3/uL (ref 0.0–0.1)
Immature Granulocytes: 0 %
Lymphocytes Absolute: 1.9 10*3/uL (ref 0.7–3.1)
Lymphs: 22 %
MCH: 24 pg — ABNORMAL LOW (ref 26.6–33.0)
MCHC: 31.9 g/dL (ref 31.5–35.7)
MCV: 75 fL — ABNORMAL LOW (ref 79–97)
Monocytes Absolute: 0.6 10*3/uL (ref 0.1–0.9)
Monocytes: 7 %
Neutrophils Absolute: 6.3 10*3/uL (ref 1.4–7.0)
Neutrophils: 70 %
Platelets: 378 10*3/uL (ref 150–450)
RBC: 5.13 x10E6/uL (ref 3.77–5.28)
RDW: 16.7 % — ABNORMAL HIGH (ref 11.7–15.4)
WBC: 9 10*3/uL (ref 3.4–10.8)

## 2021-01-06 LAB — LIPID PANEL
Chol/HDL Ratio: 5.6 ratio — ABNORMAL HIGH (ref 0.0–4.4)
Cholesterol, Total: 245 mg/dL — ABNORMAL HIGH (ref 100–199)
HDL: 44 mg/dL (ref >39)
LDL Chol Calc (NIH): 153 mg/dL — ABNORMAL HIGH (ref 0–99)
Triglycerides: 262 mg/dL — ABNORMAL HIGH (ref 0–149)
VLDL Cholesterol Cal: 48 mg/dL — ABNORMAL HIGH (ref 5–40)

## 2021-01-06 LAB — HEMOGLOBIN A1C
Est. average glucose Bld gHb Est-mCnc: 140 mg/dL
Hgb A1c MFr Bld: 6.5 % — ABNORMAL HIGH (ref 4.8–5.6)

## 2021-01-06 LAB — TSH: TSH: 2.78 u[IU]/mL (ref 0.450–4.500)

## 2021-01-06 LAB — CARDIOVASCULAR RISK ASSESSMENT

## 2021-01-06 MED ORDER — ATORVASTATIN CALCIUM 10 MG PO TABS: 10.0000 mg | ORAL_TABLET | Freq: Every day | ORAL | 3 refills | Status: DC

## 2021-01-08 ENCOUNTER — Other Ambulatory Visit: Payer: Self-pay | Admitting: Physician Assistant

## 2021-01-08 DIAGNOSIS — D508 Other iron deficiency anemias: Secondary | ICD-10-CM

## 2021-01-08 DIAGNOSIS — R790 Abnormal level of blood mineral: Secondary | ICD-10-CM

## 2021-01-08 LAB — IRON AND TIBC
Iron Saturation: 8 % — CL (ref 15–55)
Iron: 32 ug/dL (ref 27–159)
Total Iron Binding Capacity: 389 ug/dL (ref 250–450)
UIBC: 357 ug/dL (ref 131–425)

## 2021-01-08 LAB — SPECIMEN STATUS REPORT

## 2021-01-08 LAB — FERRITIN: Ferritin: 8 ng/mL — ABNORMAL LOW (ref 15–150)

## 2021-01-09 ENCOUNTER — Telehealth: Payer: Self-pay | Admitting: Oncology

## 2021-01-09 NOTE — Telephone Encounter (Signed)
Scheduled appt per 11/1 referral. Pt is aware of appt date and time.

## 2021-01-12 ENCOUNTER — Telehealth: Payer: Self-pay | Admitting: Physician Assistant

## 2021-01-12 NOTE — Telephone Encounter (Signed)
   Kelly Wilkins has been scheduled for the following appointment:  WHAT: SCREENING MAMMOGRAM WHERE: [facility] DATE: 02/06/21 TIME: 3:15 PM ARRIVAL TIME  Patient has been made aware.

## 2021-01-21 ENCOUNTER — Other Ambulatory Visit: Payer: Self-pay | Admitting: Physician Assistant

## 2021-01-22 NOTE — Progress Notes (Signed)
[facility]  [address] [place],  [place]  [address] [phone]  Clinic Day:  01/23/2021  Referring physician: Marge Duncans, PA-C   HISTORY OF PRESENT ILLNESS:  The patient is a 45 y.o. female  who I was asked to consult upon for iron deficiency anemia.  Recent labs showed a normal hemoglobin of 12.3, but with a low MCV of 75.  Iron studies done recently showed a low ferritin of 8, a borderline low serum iron of 32, a TIBC of 389, and a low iron saturation of 8%.  The patient denies having any overt signs of blood loss to explain her iron deficiency anemia.  Of note, the patient has undergone a complete hysterectomy 9 years ago.  In 2012, the patient had a right nephrectomy for renal cell cancer.  Gross hematuria was what led to that diagnosis being made.  At that time, she recalls being iron deficient.  She has tried taking oral iron in the past, but it caused GI upset.  The patient claims she had both a colonoscopy and EGD done a year ago, neither of which showed any obvious GI tract pathology.  PAST MEDICAL HISTORY:   Past Medical History:  Diagnosis Date   Diabetes mellitus without complication ([place])    Renal cancer ([place])   Hypertension, gastroesophageal reflux disease  PAST SURGICAL HISTORY:   Past Surgical History:  Procedure Laterality Date   ABDOMINAL HYSTERECTOMY     CHOLECYSTECTOMY     NEPHRECTOMY     TONSILLECTOMY      CURRENT MEDICATIONS:   Current Outpatient Medications  Medication Sig Dispense Refill   amLODipine (NORVASC) 5 MG tablet Take 5 mg by mouth daily.     FARXIGA 10 MG TABS tablet Take 1 tablet by mouth once daily 30 tablet 3   gabapentin (NEURONTIN) 100 MG capsule Take 100 mg by mouth 5 (five) times daily.     metFORMIN (GLUCOPHAGE) 850 MG tablet Take 850 mg by mouth 2 (two) times daily with a meal.     metoprolol (LOPRESSOR) 100 MG tablet Take 100 mg by mouth 2 (two) times daily.     rOPINIRole (REQUIP) 2 MG  tablet Take 1 tablet (2 mg total) by mouth at bedtime. 30 tablet 2   Semaglutide (RYBELSUS) 7 MG TABS TAKE 1 TABLET BY MOUTH IN THE MORNING -  30  MINUTES  BEFORE  OTHER  MEDS  OR  FOOD 30 tablet 0   No current facility-administered medications for this visit.    ALLERGIES:   Allergies  Allergen Reactions   Ibuprofen Other (See Comments)    Due to history of kidney cancer Other reaction(s): Other (See Comments) Due to history of kidney cancer   Codeine    Penicillins Nausea And Vomiting and Rash   Sulfa Antibiotics Nausea And Vomiting and Rash    FAMILY HISTORY:   Family History  Problem Relation Age of Onset   Hypertension Maternal Grandmother    Diabetes Maternal Grandmother    Hypertension Maternal Grandfather    Diabetes Maternal Grandfather    Hypertension Paternal Grandmother    Diabetes Paternal Grandmother    Hypertension Paternal Grandfather     SOCIAL HISTORY:  The patient was born and raised in [place].  She currently lives in [place] with her husband of 31 years.  She has 1 child.  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  She did smoke a pack of cigarettes daily  for 10 years before quitting 15 years ago.  She drinks alcohol on rare occasions.  REVIEW OF SYSTEMS:  Review of Systems  Constitutional:  Negative for fatigue and fever.  HENT:   Negative for hearing loss and sore throat.   Eyes:  Negative for eye problems.  Respiratory:  Negative for chest tightness, cough and hemoptysis.   Cardiovascular:  Negative for chest pain and palpitations.  Gastrointestinal:  Positive for vomiting. Negative for abdominal distention, abdominal pain, blood in stool, constipation, diarrhea and nausea.  Endocrine: Negative for hot flashes.  Genitourinary:  Negative for difficulty urinating, dysuria, frequency, hematuria and nocturia.   Musculoskeletal:  Positive for arthralgias and myalgias. Negative for back pain and gait problem.  Skin: Negative.   Negative for itching and rash.  Neurological:  Positive for headaches. Negative for dizziness, extremity weakness, gait problem, light-headedness and numbness.  Hematological: Negative.   Psychiatric/Behavioral: Negative.  Negative for depression and suicidal ideas. The patient is not nervous/anxious.     PHYSICAL EXAM:  Blood pressure (!) 186/93, pulse 80, temperature 99 F (37.2 C), resp. rate 16, height 5\' 4"  (1.626 m), weight 219 lb 12.8 oz (99.7 kg), SpO2 97 %. Wt Readings from Last 3 Encounters:  01/23/21 219 lb 12.8 oz (99.7 kg)  01/05/21 215 lb 3.2 oz (97.6 kg)  12/02/20 222 lb (100.7 kg)   Body mass index is 37.73 kg/m. Performance status (ECOG): 0 - Asymptomatic Physical Exam Constitutional:      Appearance: Normal appearance. She is not ill-appearing.  HENT:     Mouth/Throat:     Mouth: Mucous membranes are moist.     Pharynx: Oropharynx is clear. No oropharyngeal exudate or posterior oropharyngeal erythema.  Cardiovascular:     Rate and Rhythm: Normal rate and regular rhythm.     Heart sounds: No murmur heard.   No friction rub. No gallop.  Pulmonary:     Effort: Pulmonary effort is normal. No respiratory distress.     Breath sounds: Normal breath sounds. No wheezing, rhonchi or rales.  Abdominal:     General: Bowel sounds are normal. There is no distension.     Palpations: Abdomen is soft. There is no mass.     Tenderness: There is no abdominal tenderness.  Musculoskeletal:        General: No swelling.     Right lower leg: No edema.     Left lower leg: No edema.  Lymphadenopathy:     Cervical: No cervical adenopathy.     Upper Body:     Right upper body: No supraclavicular or axillary adenopathy.     Left upper body: No supraclavicular or axillary adenopathy.     Lower Body: No right inguinal adenopathy. No left inguinal adenopathy.  Skin:    General: Skin is warm.     Coloration: Skin is not jaundiced.     Findings: No lesion or rash.  Neurological:      General: No focal deficit present.     Mental Status: She is alert and oriented to person, place, and time. Mental status is at baseline.  Psychiatric:        Mood and Affect: Mood normal.        Behavior: Behavior normal.        Thought Content: Thought content normal.    LABS:   CBC Latest Ref Rng & Units 01/23/2021 01/05/2021 05/04/2012  WBC - 6.8 9.0 8.2  Hemoglobin 12.0 - 16.0 11.5(A) 12.3 13.0  Hematocrit 36 -  46 36 38.5 38.0  Platelets 150 - 399 334 378 317   CMP Latest Ref Rng & Units 01/05/2021 05/04/2012  Glucose 70 - 99 mg/dL 150(H) 116(H)  BUN 6 - 24 mg/dL 21 14  Creatinine 0.57 - 1.00 mg/dL 1.05(H) 0.75  Sodium 134 - 144 mmol/L 139 137  Potassium 3.5 - 5.2 mmol/L 5.0 4.2  Chloride 96 - 106 mmol/L 102 101  CO2 20 - 29 mmol/L 22 23  Calcium 8.7 - 10.2 mg/dL 10.0 9.6  Total Protein 6.0 - 8.5 g/dL 6.7 7.6  Total Bilirubin 0.0 - 1.2 mg/dL 0.4 0.3  Alkaline Phos 44 - 121 IU/L 97 76  AST 0 - 40 IU/L 21 21  ALT 0 - 32 IU/L 18 19    Latest Reference Range & Units 01/05/21 08:49  Iron 27 - 159 ug/dL 32  UIBC 131 - 425 ug/dL 357  TIBC 250 - 450 ug/dL 389  Ferritin 15 - 150 ng/mL 8 (L)  Iron Saturation 15 - 55 % 8 (LL)  (LL): Data is critically low (L): Data is abnormally low  ASSESSMENT & PLAN:  A 45 y.o. female who I was asked to consult upon for iron deficiency anemia.  The patient clearly sees that her labs are consistent with her iron stores being very low.  Based upon this, I will arrange for her to receive IV iron over these next few weeks to rapidly replenish her iron stores and normalize her hemoglobin.  Although she had a GI work-up from last that was negative, I do believe she warrants a repeat GI evaluation just to ensure occult GI blood loss has not developed in the interim.  Otherwise, I will see her back in 3 months to reassess her iron and hemoglobin levels to see how well she has responded to her upcoming IV iron.  The patient understands all the plans  discussed today and is in agreement with them.  I do appreciate Marge Duncans, PA-C for his new consult.   Morgen Linebaugh Macarthur Critchley, MD

## 2021-01-23 ENCOUNTER — Inpatient Hospital Stay: Payer: BC Managed Care – PPO | Attending: Oncology | Admitting: Oncology

## 2021-01-23 ENCOUNTER — Other Ambulatory Visit: Payer: Self-pay

## 2021-01-23 ENCOUNTER — Encounter: Payer: Self-pay | Admitting: Oncology

## 2021-01-23 ENCOUNTER — Telehealth: Payer: Self-pay | Admitting: Oncology

## 2021-01-23 ENCOUNTER — Other Ambulatory Visit: Payer: Self-pay | Admitting: Oncology

## 2021-01-23 ENCOUNTER — Inpatient Hospital Stay: Payer: BC Managed Care – PPO

## 2021-01-23 DIAGNOSIS — Z8553 Personal history of malignant neoplasm of renal pelvis: Secondary | ICD-10-CM | POA: Diagnosis not present

## 2021-01-23 DIAGNOSIS — I10 Essential (primary) hypertension: Secondary | ICD-10-CM | POA: Diagnosis not present

## 2021-01-23 DIAGNOSIS — Z905 Acquired absence of kidney: Secondary | ICD-10-CM | POA: Insufficient documentation

## 2021-01-23 DIAGNOSIS — E109 Type 1 diabetes mellitus without complications: Secondary | ICD-10-CM | POA: Insufficient documentation

## 2021-01-23 DIAGNOSIS — D508 Other iron deficiency anemias: Secondary | ICD-10-CM

## 2021-01-23 DIAGNOSIS — D509 Iron deficiency anemia, unspecified: Secondary | ICD-10-CM | POA: Diagnosis not present

## 2021-01-23 DIAGNOSIS — K219 Gastro-esophageal reflux disease without esophagitis: Secondary | ICD-10-CM | POA: Insufficient documentation

## 2021-01-23 DIAGNOSIS — Z85528 Personal history of other malignant neoplasm of kidney: Secondary | ICD-10-CM | POA: Insufficient documentation

## 2021-01-23 DIAGNOSIS — G2581 Restless legs syndrome: Secondary | ICD-10-CM | POA: Diagnosis not present

## 2021-01-23 DIAGNOSIS — Z87891 Personal history of nicotine dependence: Secondary | ICD-10-CM | POA: Diagnosis not present

## 2021-01-23 DIAGNOSIS — Z79899 Other long term (current) drug therapy: Secondary | ICD-10-CM | POA: Diagnosis not present

## 2021-01-23 DIAGNOSIS — D500 Iron deficiency anemia secondary to blood loss (chronic): Secondary | ICD-10-CM

## 2021-01-23 HISTORY — DX: Iron deficiency anemia secondary to blood loss (chronic): D50.0

## 2021-01-23 LAB — IRON AND TIBC
Iron: 26 ug/dL — ABNORMAL LOW (ref 28–170)
Saturation Ratios: 6 % — ABNORMAL LOW (ref 10.4–31.8)
TIBC: 439 ug/dL (ref 250–450)
UIBC: 413 ug/dL

## 2021-01-23 LAB — FOLATE: Folate: 15.3 ng/mL (ref >5.9)

## 2021-01-23 LAB — CBC AND DIFFERENTIAL
HCT: 36 (ref 36–46)
Hemoglobin: 11.5 — AB (ref 12.0–16.0)
Neutrophils Absolute: 3.54
Platelets: 334 (ref 150–399)
WBC: 6.8

## 2021-01-23 LAB — CBC: RBC: 4.81 (ref 3.87–5.11)

## 2021-01-23 LAB — FERRITIN: Ferritin: 4 ng/mL — ABNORMAL LOW (ref 11–307)

## 2021-01-23 LAB — VITAMIN B12: Vitamin B-12: 237 pg/mL (ref 180–914)

## 2021-01-23 NOTE — Progress Notes (Signed)
..  Feraheme orders changed to venofer due to insurance plan preference.  Message to scheduling has been sent.  

## 2021-01-23 NOTE — Telephone Encounter (Signed)
Per 11/18 los next appt scheduled and given to patient

## 2021-01-28 ENCOUNTER — Other Ambulatory Visit: Payer: Self-pay

## 2021-01-28 MED ORDER — OMEPRAZOLE 40 MG PO CPDR: 40.0000 mg | DELAYED_RELEASE_CAPSULE | Freq: Every day | ORAL | 2 refills | Status: DC

## 2021-01-28 MED ORDER — METOPROLOL TARTRATE 100 MG PO TABS: 100.0000 mg | ORAL_TABLET | Freq: Two times a day (BID) | ORAL | 2 refills | Status: DC

## 2021-02-03 ENCOUNTER — Inpatient Hospital Stay: Payer: BC Managed Care – PPO

## 2021-02-03 ENCOUNTER — Other Ambulatory Visit: Payer: Self-pay

## 2021-02-03 VITALS — BP 142/85 | HR 92 | Temp 98.2°F | Resp 18 | Ht 64.0 in | Wt 219.0 lb

## 2021-02-03 DIAGNOSIS — D509 Iron deficiency anemia, unspecified: Secondary | ICD-10-CM | POA: Diagnosis not present

## 2021-02-03 DIAGNOSIS — D500 Iron deficiency anemia secondary to blood loss (chronic): Secondary | ICD-10-CM

## 2021-02-03 MED ORDER — SODIUM CHLORIDE 0.9 % IV SOLN: Freq: Once | INTRAVENOUS | Status: AC

## 2021-02-03 MED ORDER — SODIUM CHLORIDE 0.9 % IV SOLN
300.0000 mg | Freq: Once | INTRAVENOUS | Status: AC
  Administered 2021-02-03: 300 mg via INTRAVENOUS
  Filled 2021-02-03: qty 5

## 2021-02-03 NOTE — Patient Instructions (Signed)

## 2021-02-09 MED FILL — Iron Sucrose Inj 20 MG/ML (Fe Equiv): INTRAVENOUS | Qty: 15 | Status: AC

## 2021-02-10 ENCOUNTER — Other Ambulatory Visit: Payer: Self-pay

## 2021-02-10 ENCOUNTER — Inpatient Hospital Stay: Payer: BC Managed Care – PPO | Attending: Oncology

## 2021-02-10 VITALS — BP 125/79 | HR 91 | Temp 98.5°F | Resp 18 | Wt 209.0 lb

## 2021-02-10 DIAGNOSIS — D509 Iron deficiency anemia, unspecified: Secondary | ICD-10-CM | POA: Diagnosis not present

## 2021-02-10 DIAGNOSIS — D500 Iron deficiency anemia secondary to blood loss (chronic): Secondary | ICD-10-CM

## 2021-02-10 MED ORDER — ALTEPLASE 2 MG IJ SOLR: 2.0000 mg | Freq: Once | INTRAMUSCULAR | Status: DC | PRN

## 2021-02-10 MED ORDER — SODIUM CHLORIDE 0.9% FLUSH: 10.0000 mL | Freq: Once | INTRAVENOUS | Status: DC | PRN

## 2021-02-10 MED ORDER — HEPARIN SOD (PORK) LOCK FLUSH 100 UNIT/ML IV SOLN: 250.0000 [IU] | Freq: Once | INTRAVENOUS | Status: DC | PRN

## 2021-02-10 MED ORDER — SODIUM CHLORIDE 0.9% FLUSH: 3.0000 mL | Freq: Once | INTRAVENOUS | Status: DC | PRN

## 2021-02-10 MED ORDER — SODIUM CHLORIDE 0.9 % IV SOLN
300.0000 mg | Freq: Once | INTRAVENOUS | Status: AC
  Administered 2021-02-10: 300 mg via INTRAVENOUS
  Filled 2021-02-10: qty 300

## 2021-02-10 MED ORDER — HEPARIN SOD (PORK) LOCK FLUSH 100 UNIT/ML IV SOLN: 500.0000 [IU] | Freq: Once | INTRAVENOUS | Status: DC | PRN

## 2021-02-10 MED ORDER — SODIUM CHLORIDE 0.9 % IV SOLN: Freq: Once | INTRAVENOUS | Status: AC

## 2021-02-10 NOTE — Progress Notes (Signed)
PATIENT DENIES NEED FOR APPT CALENDER

## 2021-02-10 NOTE — Patient Instructions (Signed)
VENOFERIron Sucrose Injection What is this medication? IRON SUCROSE (EYE ern SOO krose) treats low levels of iron (iron deficiency anemia) in people with kidney disease. Iron is a mineral that plays an important role in making red blood cells, which carry oxygen from your lungs to the rest of your body. This medicine may be used for other purposes; ask your health care provider or pharmacist if you have questions. COMMON BRAND NAME(S): Venofer What should I tell my care team before I take this medication? They need to know if you have any of these conditions: Anemia not caused by low iron levels Heart disease High levels of iron in the blood Kidney disease Liver disease An unusual or allergic reaction to iron, other medications, foods, dyes, or preservatives Pregnant or trying to get pregnant Breast-feeding How should I use this medication? This medication is for infusion into a vein. It is given in a hospital or clinic setting. Talk to your care team about the use of this medication in children. While this medication may be prescribed for children as young as 2 years for selected conditions, precautions do apply. Overdosage: If you think you have taken too much of this medicine contact a poison control center or emergency room at once. NOTE: This medicine is only for you. Do not share this medicine with others. What if I miss a dose? It is important not to miss your dose. Call your care team if you are unable to keep an appointment. What may interact with this medication? Do not take this medication with any of the following: Deferoxamine Dimercaprol Other iron products This medication may also interact with the following: Chloramphenicol Deferasirox This list may not describe all possible interactions. Give your health care provider a list of all the medicines, herbs, non-prescription drugs, or dietary supplements you use. Also tell them if you smoke, drink alcohol, or use illegal  drugs. Some items may interact with your medicine. What should I watch for while using this medication? Visit your care team regularly. Tell your care team if your symptoms do not start to get better or if they get worse. You may need blood work done while you are taking this medication. You may need to follow a special diet. Talk to your care team. Foods that contain iron include: whole grains/cereals, dried fruits, beans, or peas, leafy green vegetables, and organ meats (liver, kidney). What side effects may I notice from receiving this medication? Side effects that you should report to your care team as soon as possible: Allergic reactions--skin rash, itching, hives, swelling of the face, lips, tongue, or throat Low blood pressure--dizziness, feeling faint or lightheaded, blurry vision Shortness of breath Side effects that usually do not require medical attention (report to your care team if they continue or are bothersome): Flushing Headache Joint pain Muscle pain Nausea Pain, redness, or irritation at injection site This list may not describe all possible side effects. Call your doctor for medical advice about side effects. You may report side effects to FDA at 1-800-FDA-1088. Where should I keep my medication? This medication is given in a hospital or clinic and will not be stored at home. NOTE: This sheet is a summary. It may not cover all possible information. If you have questions about this medicine, talk to your doctor, pharmacist, or health care provider.  2022 Elsevier/Gold Standard (2020-07-18 00:00:00)

## 2021-02-16 MED FILL — Iron Sucrose Inj 20 MG/ML (Fe Equiv): INTRAVENOUS | Qty: 15 | Status: AC

## 2021-02-17 ENCOUNTER — Inpatient Hospital Stay: Payer: BC Managed Care – PPO

## 2021-02-17 ENCOUNTER — Other Ambulatory Visit: Payer: Self-pay

## 2021-02-17 VITALS — BP 169/91 | HR 77 | Temp 98.7°F | Resp 18 | Ht 64.0 in | Wt 218.0 lb

## 2021-02-17 DIAGNOSIS — D509 Iron deficiency anemia, unspecified: Secondary | ICD-10-CM | POA: Diagnosis not present

## 2021-02-17 DIAGNOSIS — D500 Iron deficiency anemia secondary to blood loss (chronic): Secondary | ICD-10-CM

## 2021-02-17 MED ORDER — SODIUM CHLORIDE 0.9 % IV SOLN
300.0000 mg | Freq: Once | INTRAVENOUS | Status: AC
  Administered 2021-02-17: 300 mg via INTRAVENOUS
  Filled 2021-02-17: qty 300

## 2021-02-17 MED ORDER — SODIUM CHLORIDE 0.9 % IV SOLN: Freq: Once | INTRAVENOUS | Status: AC

## 2021-02-17 NOTE — Progress Notes (Signed)
1611:PT STABLE AT TIME OF DISCHARGE

## 2021-02-17 NOTE — Patient Instructions (Signed)

## 2021-03-05 NOTE — Telephone Encounter (Signed)
PT NO SHOWED MAMMOGRAM

## 2021-03-14 ENCOUNTER — Other Ambulatory Visit: Payer: Self-pay | Admitting: Physician Assistant

## 2021-03-14 DIAGNOSIS — G2581 Restless legs syndrome: Secondary | ICD-10-CM

## 2021-03-23 ENCOUNTER — Other Ambulatory Visit: Payer: Self-pay | Admitting: Physician Assistant

## 2021-03-23 DIAGNOSIS — G2581 Restless legs syndrome: Secondary | ICD-10-CM

## 2021-04-13 ENCOUNTER — Other Ambulatory Visit: Payer: Self-pay | Admitting: Physician Assistant

## 2021-04-14 ENCOUNTER — Telehealth: Payer: Self-pay

## 2021-04-14 ENCOUNTER — Other Ambulatory Visit: Payer: Self-pay | Admitting: Physician Assistant

## 2021-04-14 DIAGNOSIS — G2581 Restless legs syndrome: Secondary | ICD-10-CM

## 2021-04-14 MED ORDER — GABAPENTIN 300 MG PO CAPS: ORAL_CAPSULE | ORAL | 0 refills | Status: DC

## 2021-04-14 NOTE — Telephone Encounter (Signed)
Patient calling requesting rx of gabapentin 300 mg five times a day. Med list does not have gabapentin listed this way. Please advise.   Last Fill: 01/14/2021 Capsules filled: 450  Pharmacy: [facility], [place] 04/14/21 2:23 PM

## 2021-04-14 NOTE — Telephone Encounter (Signed)
Yes she was supposed to call back with the correct dosing - will send in rx

## 2021-04-14 NOTE — Telephone Encounter (Signed)
Made patient aware.   Kelly Wilkins 04/14/21 3:53 PM

## 2021-04-16 ENCOUNTER — Ambulatory Visit: Payer: BC Managed Care – PPO | Admitting: Physician Assistant

## 2021-04-20 NOTE — Progress Notes (Incomplete)
[facility]  [address] [place],  [place]  [address] [phone]  Clinic Day:  04/20/2021  Referring physician: Marge Duncans, PA-C  This document serves as a record of services personally performed by Marice Potter, MD. It was created on their behalf by [facility], a trained medical scribe. The creation of this record is based on the scribe's personal observations and the provider's statements to them.  HISTORY OF PRESENT ILLNESS:  The patient is a 46 y.o. female  who I recently began seeing for iron deficiency anemia.  Recent labs showed a normal hemoglobin of 12.3, but with a low MCV of 75.  Iron studies done recently showed a low ferritin of 8, a borderline low serum iron of 32, a TIBC of 389, and a low iron saturation of 8%.  The patient denies having any overt signs of blood loss to explain her iron deficiency anemia.  Of note, the patient has undergone a complete hysterectomy 9 years ago.  In 2012, the patient had a right nephrectomy for renal cell cancer.  Gross hematuria was what led to that diagnosis being made.  At that time, she recalls being iron deficient.  She has tried taking oral iron in the past, but it caused GI upset.  The patient claims she had both a colonoscopy and EGD done a year ago, neither of which showed any obvious GI tract pathology.  PHYSICAL EXAM:  There were no vitals taken for this visit. Wt Readings from Last 3 Encounters:  02/17/21 218 lb (98.9 kg)  02/10/21 209 lb (94.8 kg)  02/03/21 219 lb (99.3 kg)   There is no height or weight on file to calculate BMI. Performance status (ECOG): 0 - Asymptomatic Physical Exam Constitutional:      Appearance: Normal appearance. She is not ill-appearing.  HENT:     Mouth/Throat:     Mouth: Mucous membranes are moist.     Pharynx: Oropharynx is clear. No oropharyngeal exudate or posterior oropharyngeal erythema.  Cardiovascular:     Rate and Rhythm: Normal rate and regular  rhythm.     Heart sounds: No murmur heard.   No friction rub. No gallop.  Pulmonary:     Effort: Pulmonary effort is normal. No respiratory distress.     Breath sounds: Normal breath sounds. No wheezing, rhonchi or rales.  Abdominal:     General: Bowel sounds are normal. There is no distension.     Palpations: Abdomen is soft. There is no mass.     Tenderness: There is no abdominal tenderness.  Musculoskeletal:        General: No swelling.     Right lower leg: No edema.     Left lower leg: No edema.  Lymphadenopathy:     Cervical: No cervical adenopathy.     Upper Body:     Right upper body: No supraclavicular or axillary adenopathy.     Left upper body: No supraclavicular or axillary adenopathy.     Lower Body: No right inguinal adenopathy. No left inguinal adenopathy.  Skin:    General: Skin is warm.     Coloration: Skin is not jaundiced.     Findings: No lesion or rash.  Neurological:     General: No focal deficit present.     Mental Status: She is alert and oriented to person, place, and time. Mental status is at baseline.  Psychiatric:        Mood and Affect: Mood normal.  Behavior: Behavior normal.        Thought Content: Thought content normal.    LABS:   CBC Latest Ref Rng & Units 01/23/2021 01/05/2021 05/04/2012  WBC - 6.8 9.0 8.2  Hemoglobin 12.0 - 16.0 11.5(A) 12.3 13.0  Hematocrit 36 - 46 36 38.5 38.0  Platelets 150 - 399 334 378 317   CMP Latest Ref Rng & Units 01/05/2021 05/04/2012  Glucose 70 - 99 mg/dL 150(H) 116(H)  BUN 6 - 24 mg/dL 21 14  Creatinine 0.57 - 1.00 mg/dL 1.05(H) 0.75  Sodium 134 - 144 mmol/L 139 137  Potassium 3.5 - 5.2 mmol/L 5.0 4.2  Chloride 96 - 106 mmol/L 102 101  CO2 20 - 29 mmol/L 22 23  Calcium 8.7 - 10.2 mg/dL 10.0 9.6  Total Protein 6.0 - 8.5 g/dL 6.7 7.6  Total Bilirubin 0.0 - 1.2 mg/dL 0.4 0.3  Alkaline Phos 44 - 121 IU/L 97 76  AST 0 - 40 IU/L 21 21  ALT 0 - 32 IU/L 18 19    Latest Reference Range & Units 01/05/21  08:49  Iron 27 - 159 ug/dL 32  UIBC 131 - 425 ug/dL 357  TIBC 250 - 450 ug/dL 389  Ferritin 15 - 150 ng/mL 8 (L)  Iron Saturation 15 - 55 % 8 (LL)  (LL): Data is critically low (L): Data is abnormally low  ASSESSMENT & PLAN:  A 46 y.o. female who I was asked to consult upon for iron deficiency anemia.  The patient clearly sees that her labs are consistent with her iron stores being very low.  Based upon this, I will arrange for her to receive IV iron over these next few weeks to rapidly replenish her iron stores and normalize her hemoglobin.  Although she had a GI work-up from last that was negative, I do believe she warrants a repeat GI evaluation just to ensure occult GI blood loss has not developed in the interim.  Otherwise, I will see her back in 3 months to reassess her iron and hemoglobin levels to see how well she has responded to her upcoming IV iron.  The patient understands all the plans discussed today and is in agreement with them.   I, Rita Ohara, am acting as scribe for Marice Potter, MD    I have reviewed this report as typed by the medical scribe, and it is complete and accurate.  Dequincy Macarthur Critchley, MD

## 2021-04-24 ENCOUNTER — Other Ambulatory Visit: Payer: BC Managed Care – PPO

## 2021-04-24 ENCOUNTER — Ambulatory Visit: Payer: BC Managed Care – PPO | Admitting: Oncology

## 2021-04-27 ENCOUNTER — Other Ambulatory Visit: Payer: Self-pay

## 2021-04-27 MED ORDER — METOPROLOL TARTRATE 100 MG PO TABS: 100.0000 mg | ORAL_TABLET | Freq: Two times a day (BID) | ORAL | 0 refills | Status: DC

## 2021-05-04 ENCOUNTER — Other Ambulatory Visit: Payer: Self-pay | Admitting: Physician Assistant

## 2021-05-22 ENCOUNTER — Ambulatory Visit: Payer: BC Managed Care – PPO | Admitting: Physician Assistant

## 2021-05-23 ENCOUNTER — Other Ambulatory Visit: Payer: Self-pay | Admitting: Physician Assistant

## 2021-05-23 ENCOUNTER — Other Ambulatory Visit: Payer: Self-pay | Admitting: Family Medicine

## 2021-06-02 ENCOUNTER — Other Ambulatory Visit: Payer: Self-pay | Admitting: Physician Assistant

## 2021-06-02 DIAGNOSIS — G2581 Restless legs syndrome: Secondary | ICD-10-CM

## 2021-06-03 ENCOUNTER — Encounter: Payer: Self-pay | Admitting: Physician Assistant

## 2021-06-03 ENCOUNTER — Ambulatory Visit: Payer: BC Managed Care – PPO | Admitting: Physician Assistant

## 2021-06-03 ENCOUNTER — Other Ambulatory Visit: Payer: Self-pay

## 2021-06-03 VITALS — BP 144/102 | HR 83 | Temp 98.3°F | Ht 64.0 in | Wt 226.0 lb

## 2021-06-03 DIAGNOSIS — D508 Other iron deficiency anemias: Secondary | ICD-10-CM | POA: Diagnosis not present

## 2021-06-03 DIAGNOSIS — I10 Essential (primary) hypertension: Secondary | ICD-10-CM

## 2021-06-03 DIAGNOSIS — R002 Palpitations: Secondary | ICD-10-CM | POA: Diagnosis not present

## 2021-06-03 DIAGNOSIS — E1029 Type 1 diabetes mellitus with other diabetic kidney complication: Secondary | ICD-10-CM

## 2021-06-03 DIAGNOSIS — E782 Mixed hyperlipidemia: Secondary | ICD-10-CM | POA: Diagnosis not present

## 2021-06-03 DIAGNOSIS — K219 Gastro-esophageal reflux disease without esophagitis: Secondary | ICD-10-CM

## 2021-06-03 DIAGNOSIS — Z85528 Personal history of other malignant neoplasm of kidney: Secondary | ICD-10-CM

## 2021-06-03 MED ORDER — AMLODIPINE BESYLATE 10 MG PO TABS
10.0000 mg | ORAL_TABLET | Freq: Every day | ORAL | 0 refills | Status: DC
Start: 2021-06-03 — End: 2021-07-02

## 2021-06-03 MED ORDER — METOPROLOL TARTRATE 100 MG PO TABS: 100.0000 mg | ORAL_TABLET | Freq: Two times a day (BID) | ORAL | 1 refills | Status: DC

## 2021-06-03 NOTE — Progress Notes (Signed)
? ?New Patient Office Visit ? ?Subjective:  ?Patient ID: Kelly Wilkins, female    DOB: October 08, 1975  Age: 46 y.o. MRN: [id number] ? ?CC:  ?Chief Complaint  ?Patient presents with  ? Diabetes  ? ? ?HPI ?Kelly Wilkins presents for diabetes - pt states that she has had for about 10 years - states currently she is on rybelsus, glucophage, and farxiga -- voices no cocnerns or problems today except that she has been stress eating recently and thinks her lab values will be elevated ? ?Pt with history of neuralgia - she takes neurontin $RemoveBeforeD'300mg'DRyrbkusBrMSjW$  5 times daily - this problem was caused after she had her right kidney removed for history of renal cancer in 2012 ? ?Pt with history of hypertension - currently on amlodopine, lisinopril and  metoprolol - states bp has been elevated at home recently --- denies chest pain or dyspnea ? ?Pt with history of restless leg syndrome - currently on Requip 2 mg - states it is helping with her symptoms ? ?Pt with history of GERD - currently stable on omeprazole $RemoveBefor'40mg'epPAtYLjMQOm$  qd ? ?Since last being seen pt has been diagnosed with iron deficiency anemia and has been following with hematology and received iron infusions - her next follow up is next month ?A GI workup has been recommended as well ? ?Mixed hyperlipidemia  ?Pt presents with hyperlipidemia. The patient is compliant with medications, maintains a low cholesterol diet , follows up as directed , and maintains an exercise regimen . The patient denies experiencing any hypercholesterolemia related symptoms. Pt currently on lipitor $RemoveBe'10mg'AhWVQtrZs$  qd ? ?Pt states that she has had 'heart flutters' as long as she can remember however in the past few months they have increased in frequency and intensity.  At times it is associated with mild pain in her neck ?Symptoms not related to exertion ? ? ?Past Medical History:  ?Diagnosis Date  ? Diabetes mellitus without complication ([place])   ? Renal cancer ([place])   ? ? ?Past Surgical History:  ?Procedure Laterality Date  ?  ABDOMINAL HYSTERECTOMY    ? CHOLECYSTECTOMY    ? NEPHRECTOMY    ? TONSILLECTOMY    ? ? ?Family History  ?Problem Relation Age of Onset  ? Hypertension Maternal Grandmother   ? Diabetes Maternal Grandmother   ? Hypertension Maternal Grandfather   ? Diabetes Maternal Grandfather   ? Hypertension Paternal Grandmother   ? Diabetes Paternal Grandmother   ? Hypertension Paternal Grandfather   ? ? ?Social History  ? ?Socioeconomic History  ? Marital status: Married  ?  Spouse name: Not on file  ? Number of children: Not on file  ? Years of education: Not on file  ? Highest education level: Not on file  ?Occupational History  ? Not on file  ?Tobacco Use  ? Smoking status: Former  ?  Years: 10.00  ?  Types: Cigarettes  ? Smokeless tobacco: Not on file  ? Tobacco comments:  ?  Stopped 12 years ago  ?Substance and Sexual Activity  ? Alcohol use: Yes  ?  Comment: occasional  ? Drug use: No  ? Sexual activity: Not on file  ?Other Topics Concern  ? Not on file  ?Social History Narrative  ? Not on file  ? ?Social Determinants of Health  ? ?Financial Resource Strain: Not on file  ?Food Insecurity: Not on file  ?Transportation Needs: Not on file  ?Physical Activity: Not on file  ?Stress: Not on file  ?Social Connections: Not on file  ?  Intimate Partner Violence: Not on file  ? ? ? ?Current Outpatient Medications:  ?  amLODipine (NORVASC) 10 MG tablet, Take 1 tablet (10 mg total) by mouth daily., Disp: 90 tablet, Rfl: 0 ?  atorvastatin (LIPITOR) 10 MG tablet, Take by mouth., Disp: , Rfl:  ?  dicyclomine (BENTYL) 20 MG tablet, Take by mouth., Disp: , Rfl:  ?  FARXIGA 10 MG TABS tablet, Take 1 tablet by mouth once daily, Disp: 30 tablet, Rfl: 0 ?  gabapentin (NEURONTIN) 300 MG capsule, TAKE 1 CAPSULE BY MOUTH FIVE TIMES DAILY, Disp: 150 capsule, Rfl: 0 ?  lisinopril (ZESTRIL) 40 MG tablet, Take by mouth., Disp: , Rfl:  ?  metFORMIN (GLUCOPHAGE) 850 MG tablet, Take 850 mg by mouth 2 (two) times daily with a meal., Disp: , Rfl:  ?   omeprazole (PRILOSEC) 40 MG capsule, Take 1 capsule (40 mg total) by mouth daily., Disp: 30 capsule, Rfl: 2 ?  rOPINIRole (REQUIP) 2 MG tablet, TAKE 1 TABLET BY MOUTH AT BEDTIME, Disp: 30 tablet, Rfl: 0 ?  RYBELSUS 7 MG TABS, TAKE 1 TABLET BY MOUTH IN THE MORNING - 30 MINUTES BEFORE OTHER MEDS OR FOOD, Disp: 30 tablet, Rfl: 0 ?  metoprolol tartrate (LOPRESSOR) 100 MG tablet, Take 1 tablet (100 mg total) by mouth 2 (two) times daily., Disp: 180 tablet, Rfl: 1 ? ? ?Allergies  ?Allergen Reactions  ? Ibuprofen Other (See Comments)  ?  Due to history of kidney cancer ?Other reaction(s): Other (See Comments) ?Due to history of kidney cancer  ? Codeine   ? Penicillins Nausea And Vomiting and Rash  ? Sulfa Antibiotics Nausea And Vomiting and Rash  ? ?CONSTITUTIONAL: Negative for chills, fatigue, fever, unintentional weight gain and unintentional weight loss.  ?E/N/T: Negative for ear pain, nasal congestion and sore throat.  ?CARDIOVASCULAR   see HPI ?RESPIRATORY: Negative for recent cough and dyspnea.  ?GASTROINTESTINAL: Negative for abdominal pain, acid reflux symptoms, constipation, diarrhea, nausea and vomiting.  ?MSK: Negative for arthralgias and myalgias.  ?INTEGUMENTARY: Negative for rash.  ?NEUROLOGICAL: Negative for dizziness and headaches.  ?PSYCHIATRIC: Negative for sleep disturbance and to question depression screen.  Negative for depression, negative for anhedonia.  ?   ? ?  ?Objective:  ?PHYSICAL EXAM:  ? ?VS: BP (!) 144/102 (BP Location: Left Arm, Patient Position: Sitting)   Pulse 83   Temp 98.3 ?F (36.8 ?C) (Oral)   Ht $R'5\' 4"'wD$  (1.626 m)   Wt 226 lb (102.5 kg)   SpO2 98%   BMI 38.79 kg/m?  ? ?GEN: Well nourished, well developed, in no acute distress  ?Cardiac: RRR; no murmurs, rubs, or gallops,no edema - ?Respiratory:  normal respiratory rate and pattern with no distress - normal breath sounds with no rales, rhonchi, wheezes or rubs ?GI: normal bowel sounds, no masses or tenderness ?MS: no deformity or  atrophy  ?Skin: warm and dry, no rash  ?Psych: euthymic mood, appropriate affect and demeanor ? ?EKG - no acute changes ?Health Maintenance Due  ?Topic Date Due  ? OPHTHALMOLOGY EXAM  Never done  ? COLONOSCOPY (Pts 45-22yrs Insurance coverage will need to be confirmed)  Never done  ? ? ?There are no preventive care reminders to display for this patient. ? ?Lab Results  ?Component Value Date  ? TSH 2.780 01/05/2021  ? ?Lab Results  ?Component Value Date  ? WBC 6.8 01/23/2021  ? HGB 11.5 (A) 01/23/2021  ? HCT 36 01/23/2021  ? MCV 75 (L) 01/05/2021  ? PLT 334  01/23/2021  ? ?Lab Results  ?Component Value Date  ? NA 139 01/05/2021  ? K 5.0 01/05/2021  ? CO2 22 01/05/2021  ? GLUCOSE 150 (H) 01/05/2021  ? BUN 21 01/05/2021  ? CREATININE 1.05 (H) 01/05/2021  ? BILITOT 0.4 01/05/2021  ? ALKPHOS 97 01/05/2021  ? AST 21 01/05/2021  ? ALT 18 01/05/2021  ? PROT 6.7 01/05/2021  ? ALBUMIN 4.6 01/05/2021  ? CALCIUM 10.0 01/05/2021  ? EGFR 66 01/05/2021  ? ?Lab Results  ?Component Value Date  ? CHOL 245 (H) 01/05/2021  ? ?Lab Results  ?Component Value Date  ? HDL 44 01/05/2021  ? ?Lab Results  ?Component Value Date  ? LDLCALC 153 (H) 01/05/2021  ? ?Lab Results  ?Component Value Date  ? TRIG 262 (H) 01/05/2021  ? ?Lab Results  ?Component Value Date  ? CHOLHDL 5.6 (H) 01/05/2021  ? ?Lab Results  ?Component Value Date  ? HGBA1C 6.5 (H) 01/05/2021  ? ? ?  ?Assessment & Plan:  ? ?Problem List Items Addressed This Visit   ? ?  ? Cardiovascular and Mediastinum  ? Benign hypertension  ? Relevant Medications  ? Increase norvasc to $RemoveBe'10mg'mXcgpkLkM$  qd and continue other meds as directed  ?  ? Digestive  ? Gastroesophageal reflux disease without esophagitis ?Continue meds  ?  ? Endocrine  ? Diabetes mellitus type I ([place]) - Primary  ? Relevant Medications  ? dapagliflozin propanediol (FARXIGA) 10 MG TABS tablet  ? metFORMIN (GLUCOPHAGE) 850 MG tablet  ? Semaglutide (RYBELSUS) 7 MG TABS ?Labwork pending  ?  ? Restless leg syndrome ?Continue current meds ? ?   ? Palpitations ?Labwork pending ?Cardiology referral ? ?Iron deficiency anemia ?Continue to follow with hematology ?GI referral made  ?   ?   ?   ?   ?   ?   ?   ? ? ?Meds ordered this encounter  ?Medications  ? amLO

## 2021-06-04 ENCOUNTER — Other Ambulatory Visit: Payer: Self-pay | Admitting: Physician Assistant

## 2021-06-04 DIAGNOSIS — E1029 Type 1 diabetes mellitus with other diabetic kidney complication: Secondary | ICD-10-CM

## 2021-06-04 LAB — CBC WITH DIFFERENTIAL/PLATELET
Basophils Absolute: 0.1 10*3/uL (ref 0.0–0.2)
Basos: 1 %
EOS (ABSOLUTE): 0.1 10*3/uL (ref 0.0–0.4)
Eos: 1 %
Hematocrit: 43.3 % (ref 34.0–46.6)
Hemoglobin: 14.5 g/dL (ref 11.1–15.9)
Immature Grans (Abs): 0 10*3/uL (ref 0.0–0.1)
Immature Granulocytes: 0 %
Lymphocytes Absolute: 2.2 10*3/uL (ref 0.7–3.1)
Lymphs: 25 %
MCH: 28.5 pg (ref 26.6–33.0)
MCHC: 33.5 g/dL (ref 31.5–35.7)
MCV: 85 fL (ref 79–97)
Monocytes Absolute: 0.8 10*3/uL (ref 0.1–0.9)
Monocytes: 9 %
Neutrophils Absolute: 5.7 10*3/uL (ref 1.4–7.0)
Neutrophils: 64 %
Platelets: 282 10*3/uL (ref 150–450)
RBC: 5.09 x10E6/uL (ref 3.77–5.28)
RDW: 13 % (ref 11.7–15.4)
WBC: 8.9 10*3/uL (ref 3.4–10.8)

## 2021-06-04 LAB — IRON,TIBC AND FERRITIN PANEL
Ferritin: 53 ng/mL (ref 15–150)
Iron Saturation: 28 % (ref 15–55)
Iron: 97 ug/dL (ref 27–159)
Total Iron Binding Capacity: 346 ug/dL (ref 250–450)
UIBC: 249 ug/dL (ref 131–425)

## 2021-06-04 LAB — HEMOGLOBIN A1C
Est. average glucose Bld gHb Est-mCnc: 163 mg/dL
Hgb A1c MFr Bld: 7.3 % — ABNORMAL HIGH (ref 4.8–5.6)

## 2021-06-04 LAB — COMPREHENSIVE METABOLIC PANEL
ALT: 27 IU/L (ref 0–32)
AST: 26 IU/L (ref 0–40)
Albumin/Globulin Ratio: 1.7 (ref 1.2–2.2)
Albumin: 4.3 g/dL (ref 3.8–4.8)
Alkaline Phosphatase: 112 IU/L (ref 44–121)
BUN/Creatinine Ratio: 26 — ABNORMAL HIGH (ref 9–23)
BUN: 19 mg/dL (ref 6–24)
Bilirubin Total: 0.8 mg/dL (ref 0.0–1.2)
CO2: 24 mmol/L (ref 20–29)
Calcium: 9.8 mg/dL (ref 8.7–10.2)
Chloride: 99 mmol/L (ref 96–106)
Creatinine, Ser: 0.72 mg/dL (ref 0.57–1.00)
Globulin, Total: 2.5 g/dL (ref 1.5–4.5)
Glucose: 140 mg/dL — ABNORMAL HIGH (ref 70–99)
Potassium: 5.1 mmol/L (ref 3.5–5.2)
Sodium: 137 mmol/L (ref 134–144)
Total Protein: 6.8 g/dL (ref 6.0–8.5)
eGFR: 103 mL/min/{1.73_m2} (ref >59)

## 2021-06-04 LAB — LIPID PANEL
Chol/HDL Ratio: 3.3 ratio (ref 0.0–4.4)
Cholesterol, Total: 163 mg/dL (ref 100–199)
HDL: 49 mg/dL (ref >39)
LDL Chol Calc (NIH): 84 mg/dL (ref 0–99)
Triglycerides: 175 mg/dL — ABNORMAL HIGH (ref 0–149)
VLDL Cholesterol Cal: 30 mg/dL (ref 5–40)

## 2021-06-04 LAB — CARDIOVASCULAR RISK ASSESSMENT

## 2021-06-04 LAB — TSH: TSH: 1.74 u[IU]/mL (ref 0.450–4.500)

## 2021-06-04 MED ORDER — RYBELSUS 7 MG PO TABS: ORAL_TABLET | ORAL | 1 refills | Status: DC

## 2021-06-19 ENCOUNTER — Other Ambulatory Visit: Payer: Self-pay

## 2021-06-19 MED ORDER — METFORMIN HCL 850 MG PO TABS
850.0000 mg | ORAL_TABLET | Freq: Two times a day (BID) | ORAL | 1 refills | Status: DC
Start: 2021-06-19 — End: 2021-09-15

## 2021-06-19 MED ORDER — METFORMIN HCL 850 MG PO TABS: 850.0000 mg | ORAL_TABLET | Freq: Two times a day (BID) | ORAL | 1 refills | Status: DC

## 2021-06-24 ENCOUNTER — Ambulatory Visit: Payer: BC Managed Care – PPO

## 2021-06-24 VITALS — BP 128/72

## 2021-06-24 DIAGNOSIS — I10 Essential (primary) hypertension: Secondary | ICD-10-CM

## 2021-06-24 NOTE — Progress Notes (Signed)
BP readings at home and today in office are much better. Follow up as scheduled. ?

## 2021-06-30 ENCOUNTER — Other Ambulatory Visit: Payer: Self-pay | Admitting: Physician Assistant

## 2021-06-30 DIAGNOSIS — G2581 Restless legs syndrome: Secondary | ICD-10-CM

## 2021-07-02 ENCOUNTER — Other Ambulatory Visit: Payer: Self-pay | Admitting: Physician Assistant

## 2021-07-02 ENCOUNTER — Other Ambulatory Visit: Payer: Self-pay

## 2021-07-02 DIAGNOSIS — I10 Essential (primary) hypertension: Secondary | ICD-10-CM

## 2021-07-02 MED ORDER — AMLODIPINE BESYLATE 10 MG PO TABS: 10.0000 mg | ORAL_TABLET | Freq: Every day | ORAL | 0 refills | Status: DC

## 2021-07-09 ENCOUNTER — Ambulatory Visit: Payer: BC Managed Care – PPO | Admitting: Cardiology

## 2021-07-27 ENCOUNTER — Other Ambulatory Visit: Payer: Self-pay | Admitting: Physician Assistant

## 2021-08-09 ENCOUNTER — Other Ambulatory Visit: Payer: Self-pay | Admitting: Physician Assistant

## 2021-08-20 ENCOUNTER — Ambulatory Visit: Payer: BC Managed Care – PPO | Admitting: Cardiology

## 2021-08-20 ENCOUNTER — Other Ambulatory Visit: Payer: Self-pay

## 2021-08-20 ENCOUNTER — Other Ambulatory Visit: Payer: Self-pay | Admitting: Physician Assistant

## 2021-08-20 ENCOUNTER — Telehealth: Payer: Self-pay

## 2021-08-20 ENCOUNTER — Telehealth: Payer: Self-pay | Admitting: Cardiology

## 2021-08-20 DIAGNOSIS — R002 Palpitations: Secondary | ICD-10-CM

## 2021-08-20 DIAGNOSIS — G2581 Restless legs syndrome: Secondary | ICD-10-CM

## 2021-08-20 NOTE — Telephone Encounter (Signed)
Patient called at 1519 questioning if Cardiology referral could be changed to different location/provider. She was referred in March by Marge Duncans due to palpitations. She has been scheduled twice (5/4 and 6/15) but both appointments were cancelled due to the provider. These are documented in Epic. Patient is frustrated they have cancelled twice and would like to see someone else. Requests location near [place] area. New referral placed.   Kelly Wilkins 08/20/21 3:55 PM

## 2021-08-20 NOTE — Telephone Encounter (Signed)
Pt called and stated that she no longer wants to schedule and will find another cardiologist.

## 2021-09-08 ENCOUNTER — Other Ambulatory Visit: Payer: Self-pay | Admitting: Physician Assistant

## 2021-09-10 ENCOUNTER — Other Ambulatory Visit: Payer: Self-pay | Admitting: Physician Assistant

## 2021-09-14 ENCOUNTER — Other Ambulatory Visit: Payer: Self-pay | Admitting: Physician Assistant

## 2021-09-14 DIAGNOSIS — G2581 Restless legs syndrome: Secondary | ICD-10-CM

## 2021-09-18 ENCOUNTER — Ambulatory Visit: Payer: BC Managed Care – PPO | Admitting: Physician Assistant

## 2021-09-26 ENCOUNTER — Other Ambulatory Visit: Payer: Self-pay | Admitting: Physician Assistant

## 2021-09-26 DIAGNOSIS — I10 Essential (primary) hypertension: Secondary | ICD-10-CM

## 2021-09-28 NOTE — Telephone Encounter (Signed)
Call pt - she is overdue for follow up and labwork - will send in one month of med after appt scheduled

## 2021-10-08 ENCOUNTER — Other Ambulatory Visit: Payer: Self-pay | Admitting: Family Medicine

## 2021-10-12 LAB — HM COLONOSCOPY

## 2021-10-13 ENCOUNTER — Other Ambulatory Visit: Payer: Self-pay | Admitting: Physician Assistant

## 2021-10-13 DIAGNOSIS — G2581 Restless legs syndrome: Secondary | ICD-10-CM

## 2021-10-14 ENCOUNTER — Other Ambulatory Visit: Payer: Self-pay | Admitting: Physician Assistant

## 2021-10-18 ENCOUNTER — Other Ambulatory Visit: Payer: Self-pay | Admitting: Family Medicine

## 2021-11-10 ENCOUNTER — Other Ambulatory Visit: Payer: Self-pay | Admitting: Physician Assistant

## 2021-11-16 ENCOUNTER — Other Ambulatory Visit: Payer: Self-pay | Admitting: Physician Assistant

## 2021-11-17 ENCOUNTER — Encounter: Payer: Self-pay | Admitting: Physician Assistant

## 2021-11-17 ENCOUNTER — Ambulatory Visit: Payer: BC Managed Care – PPO | Admitting: Physician Assistant

## 2021-11-17 VITALS — BP 144/90 | HR 89 | Temp 97.4°F | Ht 64.0 in | Wt 240.4 lb

## 2021-11-17 DIAGNOSIS — J069 Acute upper respiratory infection, unspecified: Secondary | ICD-10-CM

## 2021-11-17 DIAGNOSIS — J40 Bronchitis, not specified as acute or chronic: Secondary | ICD-10-CM | POA: Diagnosis not present

## 2021-11-17 DIAGNOSIS — J9801 Acute bronchospasm: Secondary | ICD-10-CM | POA: Diagnosis not present

## 2021-11-17 LAB — POC COVID19 BINAXNOW: SARS Coronavirus 2 Ag: NEGATIVE

## 2021-11-17 MED ORDER — TRIAMCINOLONE ACETONIDE 40 MG/ML IJ SUSP
60.0000 mg | Freq: Once | INTRAMUSCULAR | Status: AC
  Administered 2021-11-17: 60 mg via INTRAMUSCULAR

## 2021-11-17 MED ORDER — AZITHROMYCIN 250 MG PO TABS: ORAL_TABLET | ORAL | 0 refills | Status: AC

## 2021-11-17 MED ORDER — ALBUTEROL SULFATE HFA 108 (90 BASE) MCG/ACT IN AERS: 2.0000 | INHALATION_SPRAY | Freq: Four times a day (QID) | RESPIRATORY_TRACT | 2 refills | Status: DC | PRN

## 2021-11-17 MED ORDER — BENZONATATE 100 MG PO CAPS: 100.0000 mg | ORAL_CAPSULE | Freq: Two times a day (BID) | ORAL | 0 refills | Status: DC | PRN

## 2021-11-17 NOTE — Progress Notes (Signed)
Acute Office Visit  Subjective:    Patient ID: Kelly Wilkins, female    DOB: [birth year], 46 y.o.   MRN: [id number]  Chief Complaint  Patient presents with   Cough   Nasal Congestion    HPI: Patient is in today for complaints of cold, cough and congestion for the past 5 days - husband with same symptoms Cough has been productive - pt denies fever She has had sore throat and ear pain as well She is having mild wheezing  Past Medical History:  Diagnosis Date   Diabetes mellitus without complication ([place])    Renal cancer ([place])     Past Surgical History:  Procedure Laterality Date   ABDOMINAL HYSTERECTOMY     CHOLECYSTECTOMY     NEPHRECTOMY     TONSILLECTOMY      Family History  Problem Relation Age of Onset   Hypertension Maternal Grandmother    Diabetes Maternal Grandmother    Hypertension Maternal Grandfather    Diabetes Maternal Grandfather    Hypertension Paternal Grandmother    Diabetes Paternal Grandmother    Hypertension Paternal Grandfather     Social History   Socioeconomic History   Marital status: Married    Spouse name: Not on file   Number of children: Not on file   Years of education: Not on file   Highest education level: Not on file  Occupational History   Not on file  Tobacco Use   Smoking status: Former    Years: 10.00    Types: Cigarettes   Smokeless tobacco: Not on file   Tobacco comments:    Stopped 12 years ago  Substance and Sexual Activity   Alcohol use: Yes    Comment: occasional   Drug use: No   Sexual activity: Not on file  Other Topics Concern   Not on file  Social History Narrative   Not on file   Social Determinants of Health   Financial Resource Strain: Not on file  Food Insecurity: Not on file  Transportation Needs: Not on file  Physical Activity: Not on file  Stress: Not on file  Social Connections: Not on file  Intimate Partner Violence: Not on file    Outpatient Medications Prior to Visit  Medication  Sig Dispense Refill   amLODipine (NORVASC) 10 MG tablet Take 1 tablet by mouth once daily 90 tablet 0   atorvastatin (LIPITOR) 10 MG tablet Take by mouth.     dicyclomine (BENTYL) 20 MG tablet Take by mouth.     FARXIGA 10 MG TABS tablet Take 1 tablet by mouth once daily 30 tablet 0   gabapentin (NEURONTIN) 300 MG capsule TAKE 1 CAPSULE BY MOUTH FIVE TIMES DAILY 150 capsule 0   lisinopril (ZESTRIL) 40 MG tablet Take by mouth.     metFORMIN (GLUCOPHAGE) 850 MG tablet TAKE 1 TABLET BY MOUTH TWICE DAILY WITH A MEAL 60 tablet 0   metoprolol tartrate (LOPRESSOR) 100 MG tablet Take 1 tablet (100 mg total) by mouth 2 (two) times daily. 180 tablet 1   omeprazole (PRILOSEC) 40 MG capsule Take 1 capsule by mouth once daily 90 capsule 0   rOPINIRole (REQUIP) 2 MG tablet TAKE 1 TABLET BY MOUTH AT BEDTIME 90 tablet 0   Semaglutide (RYBELSUS) 7 MG TABS 1 po qd 90 tablet 1   No facility-administered medications prior to visit.    Allergies  Allergen Reactions   Ibuprofen Other (See Comments) and Nausea And Vomiting    Due to history  of kidney cancer Other reaction(s): Other (See Comments) Due to history of kidney cancer Other reaction(s): Other (See Comments) Other reaction(s): Other (See Comments) Due to history of kidney cancer Due to history of kidney cancer Due to history of kidney cancer Other reaction(s): Other (See Comments) Due to history of kidney cancer   Codeine Rash   Penicillins Nausea And Vomiting and Rash   Sulfa Antibiotics Nausea And Vomiting and Rash    Review of Systems CONSTITUTIONAL: Negative for chills, fatigue, fever, unintentional weight gain and unintentional weight loss.  E/N/T: see HPI CARDIOVASCULAR: Negative for chest pain,  RESPIRATORY: see HPI GASTROINTESTINAL: Negative for abdominal pain, acid reflux symptoms, constipation, diarrhea, nausea and vomiting.          Objective:  PHYSICAL EXAM:   VS: BP (!) 144/90 (BP Location: Left Arm, Patient Position:  Sitting, Cuff Size: Large)   Pulse 89   Temp (!) 97.4 F (36.3 C) (Temporal)   Ht $R'5\' 4"'nm$  (1.626 m)   Wt 240 lb 6.4 oz (109 kg)   SpO2 97%   BMI 41.26 kg/m   GEN: Well nourished, well developed, in no acute distress  HEENT: normal external ears and nose -- Lips, Teeth and Gums - normal  Oropharynx - erythema/pnd Cardiac: RRR; no murmurs, rubs, or gallops,no edema - Respiratory:  scattered rhonchi and wheezes noted GI: normal bowel sounds, no masses or tenderness  Skin: warm and dry, no rash   Office Visit on 11/17/2021  Component Date Value Ref Range Status   SARS Coronavirus 2 Ag 11/17/2021 Negative  Negative Final     Health Maintenance Due  Topic Date Due   OPHTHALMOLOGY EXAM  Never done   INFLUENZA VACCINE  10/06/2021    There are no preventive care reminders to display for this patient.   Lab Results  Component Value Date   TSH 1.740 06/03/2021   Lab Results  Component Value Date   WBC 8.9 06/03/2021   HGB 14.5 06/03/2021   HCT 43.3 06/03/2021   MCV 85 06/03/2021   PLT 282 06/03/2021   Lab Results  Component Value Date   NA 137 06/03/2021   K 5.1 06/03/2021   CO2 24 06/03/2021   GLUCOSE 140 (H) 06/03/2021   BUN 19 06/03/2021   CREATININE 0.72 06/03/2021   BILITOT 0.8 06/03/2021   ALKPHOS 112 06/03/2021   AST 26 06/03/2021   ALT 27 06/03/2021   PROT 6.8 06/03/2021   ALBUMIN 4.3 06/03/2021   CALCIUM 9.8 06/03/2021   EGFR 103 06/03/2021   Lab Results  Component Value Date   CHOL 163 06/03/2021   Lab Results  Component Value Date   HDL 49 06/03/2021   Lab Results  Component Value Date   LDLCALC 84 06/03/2021   Lab Results  Component Value Date   TRIG 175 (H) 06/03/2021   Lab Results  Component Value Date   CHOLHDL 3.3 06/03/2021   Lab Results  Component Value Date   HGBA1C 7.3 (H) 06/03/2021       Assessment & Plan:   Problem List Items Addressed This Visit   None Visit Diagnoses     Acute respiratory disease    -  Primary    Relevant Orders   POC COVID-19 BinaxNow (Completed)   Bronchitis       Relevant Medications   triamcinolone acetonide (KENALOG-40) injection 60 mg   azithromycin (ZITHROMAX) 250 MG tablet   benzonatate (TESSALON) 100 MG capsule   Bronchospasm  Relevant Medications   triamcinolone acetonide (KENALOG-40) injection 60 mg   albuterol (VENTOLIN HFA) 108 (90 Base) MCG/ACT inhaler      Meds ordered this encounter  Medications   triamcinolone acetonide (KENALOG-40) injection 60 mg   azithromycin (ZITHROMAX) 250 MG tablet    Sig: Take 2 tablets on day 1, then 1 tablet daily on days 2 through 5    Dispense:  6 tablet    Refill:  0    Order Specific Question:   Supervising Provider    Answer:   Shelton Silvas   benzonatate (TESSALON) 100 MG capsule    Sig: Take 1 capsule (100 mg total) by mouth 2 (two) times daily as needed for cough.    Dispense:  20 capsule    Refill:  0    Order Specific Question:   Supervising Provider    Answer:   Shelton Silvas   albuterol (VENTOLIN HFA) 108 (90 Base) MCG/ACT inhaler    Sig: Inhale 2 puffs into the lungs every 6 (six) hours as needed for wheezing or shortness of breath.    Dispense:  8 g    Refill:  2    Order Specific Question:   Supervising Provider    AnswerShelton Silvas    Orders Placed This Encounter  Procedures   POC COVID-19 BinaxNow     Follow-up: Return if symptoms worsen or fail to improve.  An After Visit Summary was printed and given to the patient.  Yetta Flock [facility] [phone]

## 2021-11-23 ENCOUNTER — Other Ambulatory Visit: Payer: Self-pay | Admitting: Physician Assistant

## 2021-11-23 DIAGNOSIS — I10 Essential (primary) hypertension: Secondary | ICD-10-CM

## 2021-11-25 ENCOUNTER — Ambulatory Visit: Payer: BC Managed Care – PPO | Admitting: Physician Assistant

## 2021-11-27 ENCOUNTER — Telehealth: Payer: Self-pay

## 2021-11-27 NOTE — Telephone Encounter (Signed)
Yes agree to be seen today for further evaluation

## 2021-11-27 NOTE — Telephone Encounter (Signed)
FYI Patient checked her blood pressure at work and it was 130/110 this morning. She had bad heartburn last night and she has a headache today. She denies chest pain and SOB. I strongly recommended to go ED or UC to be checked. She verbalized to understand.

## 2021-12-07 ENCOUNTER — Ambulatory Visit: Payer: BC Managed Care – PPO | Admitting: Physician Assistant

## 2021-12-12 ENCOUNTER — Other Ambulatory Visit: Payer: Self-pay | Admitting: Physician Assistant

## 2021-12-13 ENCOUNTER — Other Ambulatory Visit: Payer: Self-pay | Admitting: Physician Assistant

## 2021-12-13 DIAGNOSIS — I10 Essential (primary) hypertension: Secondary | ICD-10-CM

## 2021-12-14 ENCOUNTER — Other Ambulatory Visit: Payer: Self-pay | Admitting: Physician Assistant

## 2021-12-14 DIAGNOSIS — I10 Essential (primary) hypertension: Secondary | ICD-10-CM

## 2021-12-14 MED ORDER — AMLODIPINE BESYLATE 10 MG PO TABS: 10.0000 mg | ORAL_TABLET | Freq: Every day | ORAL | 0 refills | Status: DC

## 2021-12-18 ENCOUNTER — Ambulatory Visit: Payer: BC Managed Care – PPO | Admitting: Physician Assistant

## 2021-12-18 ENCOUNTER — Encounter: Payer: Self-pay | Admitting: Physician Assistant

## 2021-12-18 VITALS — BP 140/100 | HR 92 | Temp 97.5°F | Ht 64.0 in | Wt 239.4 lb

## 2021-12-18 DIAGNOSIS — E1165 Type 2 diabetes mellitus with hyperglycemia: Secondary | ICD-10-CM | POA: Diagnosis not present

## 2021-12-18 DIAGNOSIS — Z23 Encounter for immunization: Secondary | ICD-10-CM

## 2021-12-18 DIAGNOSIS — K219 Gastro-esophageal reflux disease without esophagitis: Secondary | ICD-10-CM

## 2021-12-18 DIAGNOSIS — E782 Mixed hyperlipidemia: Secondary | ICD-10-CM

## 2021-12-18 DIAGNOSIS — I10 Essential (primary) hypertension: Secondary | ICD-10-CM

## 2021-12-18 MED ORDER — OLMESARTAN MEDOXOMIL 20 MG PO TABS: 20.0000 mg | ORAL_TABLET | Freq: Every day | ORAL | 2 refills | Status: DC

## 2021-12-18 NOTE — Progress Notes (Signed)
Subjective:  Patient ID: Kelly Wilkins, female    DOB: 1975-04-27  Age: 46 y.o. MRN: [id number]  Chief Complaint  Patient presents with   Hypertension    HPI  Pt in today for ED follow up after being seen for hypertension.  States at home bp was ranging 230/100s and was having headache and malaise - She was seen on 10/11 - CT of head was normal.  Troponin negative and EKG stable.  They were able to bring down bp with medications however did not discharge her on any new medications.  BP in office initially 150/106 She is currently taking lopressor '100mg'$  qd and norvasc '10mg'$  qd.  She was on lisinopril '40mg'$  at some point in the past but stopped that medication due to cough Pt has been overdue for chronic follow up  Pt with history of diabetes and overdue for follow up .  States her glucose has been ranging ok but elevated at times - was 190 at ED .  She is currently taking farxiga '10mg'$ , glucophage '850mg'$  and rybelsus '7mg'$   Pt with history of GERD - stable on omeprazole '40mg'$  qd  Pt with history of hyperlipidemia - currently on lipitor '10mg'$  qd - due for fasting labwork and will have done at follow up appt in one month  Pt would like flu shot today Current Outpatient Medications on File Prior to Visit  Medication Sig Dispense Refill   albuterol (VENTOLIN HFA) 108 (90 Base) MCG/ACT inhaler Inhale 2 puffs into the lungs every 6 (six) hours as needed for wheezing or shortness of breath. 8 g 2   amLODipine (NORVASC) 10 MG tablet Take 1 tablet (10 mg total) by mouth daily. 30 tablet 0   atorvastatin (LIPITOR) 10 MG tablet Take by mouth.     FARXIGA 10 MG TABS tablet Take 1 tablet by mouth once daily 30 tablet 0   gabapentin (NEURONTIN) 300 MG capsule TAKE 1 CAPSULE BY MOUTH FIVE TIMES DAILY 150 capsule 0   metFORMIN (GLUCOPHAGE) 850 MG tablet TAKE 1 TABLET BY MOUTH TWICE DAILY WITH A MEAL 60 tablet 0   metoprolol tartrate (LOPRESSOR) 100 MG tablet Take 1 tablet by mouth twice daily 180 tablet 0    omeprazole (PRILOSEC) 40 MG capsule Take 1 capsule by mouth once daily 90 capsule 0   rOPINIRole (REQUIP) 2 MG tablet TAKE 1 TABLET BY MOUTH AT BEDTIME 90 tablet 0   Semaglutide (RYBELSUS) 7 MG TABS 1 po qd 90 tablet 1   No current facility-administered medications on file prior to visit.   Past Medical History:  Diagnosis Date   Diabetes mellitus without complication ([place])    Renal cancer ([place])    Past Surgical History:  Procedure Laterality Date   ABDOMINAL HYSTERECTOMY     CHOLECYSTECTOMY     NEPHRECTOMY     TONSILLECTOMY      Family History  Problem Relation Age of Onset   Hypertension Maternal Grandmother    Diabetes Maternal Grandmother    Hypertension Maternal Grandfather    Diabetes Maternal Grandfather    Hypertension Paternal Grandmother    Diabetes Paternal Grandmother    Hypertension Paternal Grandfather    Social History   Socioeconomic History   Marital status: Married    Spouse name: Not on file   Number of children: Not on file   Years of education: Not on file   Highest education level: Not on file  Occupational History   Not on file  Tobacco Use  Smoking status: Former    Years: 10.00    Types: Cigarettes   Smokeless tobacco: Not on file   Tobacco comments:    Stopped 12 years ago  Substance and Sexual Activity   Alcohol use: Yes    Comment: occasional   Drug use: No   Sexual activity: Not on file  Other Topics Concern   Not on file  Social History Narrative   Not on file   Social Determinants of Health   Financial Resource Strain: Not on file  Food Insecurity: Not on file  Transportation Needs: Not on file  Physical Activity: Not on file  Stress: Not on file  Social Connections: Not on file    Review of Systems CONSTITUTIONAL: positive for malaise E/N/T: Negative for ear pain, nasal congestion and sore throat.  CARDIOVASCULAR: Negative for chest pain, dizziness, palpitations and pedal edema.  RESPIRATORY: Negative for recent  cough and dyspnea.  GASTROINTESTINAL: Negative for abdominal pain, acid reflux symptoms, constipation, diarrhea, nausea and vomiting.  MSK: Negative for arthralgias and myalgias.  INTEGUMENTARY: Negative for rash.  NEUROLOGICAL:has had headaches with elevated blood pressure PSYCHIATRIC: Negative for sleep disturbance and to question depression screen.  Negative for depression, negative for anhedonia.       Objective:  BP (!) 140/100 (BP Location: Left Arm, Cuff Size: Large)   Pulse 92   Temp (!) 97.5 F (36.4 C) (Temporal)   Ht '5\' 4"'$  (1.626 m)   Wt 239 lb 6.4 oz (108.6 kg)   SpO2 97%   BMI 41.09 kg/m      12/18/2021   10:40 AM 12/18/2021   10:28 AM 11/17/2021    9:50 AM  BP/Weight  Systolic BP 025 427 062  Diastolic BP 376 283 90  Wt. (Lbs)  239.4 240.4  BMI  41.09 kg/m2 41.26 kg/m2    Physical Exam PHYSICAL EXAM:   VS: BP (!) 140/100 (BP Location: Left Arm, Cuff Size: Large)   Pulse 92   Temp (!) 97.5 F (36.4 C) (Temporal)   Ht '5\' 4"'$  (1.626 m)   Wt 239 lb 6.4 oz (108.6 kg)   SpO2 97%   BMI 41.09 kg/m   GEN: Well nourished, well developed, in no acute distress  Cardiac: RRR; no murmurs, rubs, or gallops,no edema -  Respiratory:  normal respiratory rate and pattern with no distress - normal breath sounds with no rales, rhonchi, wheezes or rubs Skin: warm and dry, no rash   Psych: euthymic mood, appropriate affect and demeanor   Lab Results  Component Value Date   WBC 8.9 06/03/2021   HGB 14.5 06/03/2021   HCT 43.3 06/03/2021   PLT 282 06/03/2021   GLUCOSE 140 (H) 06/03/2021   CHOL 163 06/03/2021   TRIG 175 (H) 06/03/2021   HDL 49 06/03/2021   LDLCALC 84 06/03/2021   ALT 27 06/03/2021   AST 26 06/03/2021   NA 137 06/03/2021   K 5.1 06/03/2021   CL 99 06/03/2021   CREATININE 0.72 06/03/2021   BUN 19 06/03/2021   CO2 24 06/03/2021   TSH 1.740 06/03/2021   HGBA1C 7.3 (H) 06/03/2021      Assessment & Plan:   Problem List Items Addressed This  Visit       Cardiovascular and Mediastinum   Benign hypertension - Primary   Relevant Medications   olmesartan (BENICAR) 20 MG tablet Continue other meds as directed   Other Relevant Orders   CBC with Differential/Platelet   Comprehensive metabolic panel  TSH     Digestive   Gastroesophageal reflux disease without esophagitis Continue med   Other Visit Diagnoses     Mixed hyperlipidemia       Relevant Medications   olmesartan (BENICAR) 20 MG tablet Continue lipitor Watch diet   Type 2 diabetes mellitus with hyperglycemia, without long-term current use of insulin (HCC)       Relevant Medications   olmesartan (BENICAR) 20 MG tablet   Other Relevant Orders   CBC with Differential/Platelet   Comprehensive metabolic panel   Hemoglobin A1c   Microalbumin / creatinine urine ratio   Needs flu shot       Relevant Orders   Flu Vaccine QUAD 6+ mos PF IM (Fluarix Quad PF)     .  Meds ordered this encounter  Medications   olmesartan (BENICAR) 20 MG tablet    Sig: Take 1 tablet (20 mg total) by mouth daily.    Dispense:  30 tablet    Refill:  2    Order Specific Question:   Supervising Provider    AnswerShelton Silvas    Orders Placed This Encounter  Procedures   Flu Vaccine QUAD 6+ mos PF IM (Fluarix Quad PF)   CBC with Differential/Platelet   Comprehensive metabolic panel   Hemoglobin A1c   TSH   Microalbumin / creatinine urine ratio     Follow-up: Return in about 4 weeks (around 01/15/2022) for fasting follow up ---- 2 weeks for bp check.  An After Visit Summary was printed and given to the patient.  Yetta Flock [facility] [phone]

## 2021-12-19 LAB — CBC WITH DIFFERENTIAL/PLATELET
Basophils Absolute: 0.1 10*3/uL (ref 0.0–0.2)
Basos: 1 %
EOS (ABSOLUTE): 0.1 10*3/uL (ref 0.0–0.4)
Eos: 1 %
Hematocrit: 45.7 % (ref 34.0–46.6)
Hemoglobin: 15.5 g/dL (ref 11.1–15.9)
Immature Grans (Abs): 0 10*3/uL (ref 0.0–0.1)
Immature Granulocytes: 0 %
Lymphocytes Absolute: 2.2 10*3/uL (ref 0.7–3.1)
Lymphs: 22 %
MCH: 29.8 pg (ref 26.6–33.0)
MCHC: 33.9 g/dL (ref 31.5–35.7)
MCV: 88 fL (ref 79–97)
Monocytes Absolute: 0.8 10*3/uL (ref 0.1–0.9)
Monocytes: 8 %
Neutrophils Absolute: 6.7 10*3/uL (ref 1.4–7.0)
Neutrophils: 68 %
Platelets: 312 10*3/uL (ref 150–450)
RBC: 5.2 x10E6/uL (ref 3.77–5.28)
RDW: 12.1 % (ref 11.7–15.4)
WBC: 9.9 10*3/uL (ref 3.4–10.8)

## 2021-12-19 LAB — HEMOGLOBIN A1C
Est. average glucose Bld gHb Est-mCnc: 177 mg/dL
Hgb A1c MFr Bld: 7.8 % — ABNORMAL HIGH (ref 4.8–5.6)

## 2021-12-19 LAB — COMPREHENSIVE METABOLIC PANEL
ALT: 42 IU/L — ABNORMAL HIGH (ref 0–32)
AST: 35 IU/L (ref 0–40)
Albumin/Globulin Ratio: 1.8 (ref 1.2–2.2)
Albumin: 4.6 g/dL (ref 3.9–4.9)
Alkaline Phosphatase: 145 IU/L — ABNORMAL HIGH (ref 44–121)
BUN/Creatinine Ratio: 24 — ABNORMAL HIGH (ref 9–23)
BUN: 20 mg/dL (ref 6–24)
Bilirubin Total: 0.6 mg/dL (ref 0.0–1.2)
CO2: 22 mmol/L (ref 20–29)
Calcium: 10.2 mg/dL (ref 8.7–10.2)
Chloride: 99 mmol/L (ref 96–106)
Creatinine, Ser: 0.84 mg/dL (ref 0.57–1.00)
Globulin, Total: 2.6 g/dL (ref 1.5–4.5)
Glucose: 221 mg/dL — ABNORMAL HIGH (ref 70–99)
Potassium: 5.5 mmol/L — ABNORMAL HIGH (ref 3.5–5.2)
Sodium: 140 mmol/L (ref 134–144)
Total Protein: 7.2 g/dL (ref 6.0–8.5)
eGFR: 86 mL/min/{1.73_m2} (ref >59)

## 2021-12-19 LAB — TSH: TSH: 1.09 u[IU]/mL (ref 0.450–4.500)

## 2021-12-20 ENCOUNTER — Other Ambulatory Visit: Payer: Self-pay | Admitting: Physician Assistant

## 2021-12-20 LAB — MICROALBUMIN / CREATININE URINE RATIO
Creatinine, Urine: 52.4 mg/dL
Microalb/Creat Ratio: 107 mg/g creat — ABNORMAL HIGH (ref 0–29)
Microalbumin, Urine: 56.1 ug/mL

## 2021-12-21 ENCOUNTER — Other Ambulatory Visit: Payer: Self-pay | Admitting: Physician Assistant

## 2021-12-21 MED ORDER — GABAPENTIN 300 MG PO CAPS: ORAL_CAPSULE | ORAL | 0 refills | Status: DC

## 2021-12-31 ENCOUNTER — Ambulatory Visit: Payer: BC Managed Care – PPO

## 2021-12-31 NOTE — Progress Notes (Signed)
Patient came in today for Blood Pressure check.  Blood Pressure today was 124/82.

## 2022-01-03 ENCOUNTER — Other Ambulatory Visit: Payer: Self-pay | Admitting: Family Medicine

## 2022-01-07 ENCOUNTER — Other Ambulatory Visit: Payer: Self-pay | Admitting: Family Medicine

## 2022-01-07 ENCOUNTER — Other Ambulatory Visit: Payer: Self-pay | Admitting: Physician Assistant

## 2022-01-07 DIAGNOSIS — G2581 Restless legs syndrome: Secondary | ICD-10-CM

## 2022-01-07 DIAGNOSIS — I10 Essential (primary) hypertension: Secondary | ICD-10-CM

## 2022-01-15 ENCOUNTER — Other Ambulatory Visit: Payer: Self-pay | Admitting: Physician Assistant

## 2022-01-16 ENCOUNTER — Other Ambulatory Visit: Payer: Self-pay | Admitting: Physician Assistant

## 2022-01-16 DIAGNOSIS — E1029 Type 1 diabetes mellitus with other diabetic kidney complication: Secondary | ICD-10-CM

## 2022-01-18 ENCOUNTER — Other Ambulatory Visit: Payer: Self-pay | Admitting: Physician Assistant

## 2022-01-19 ENCOUNTER — Ambulatory Visit: Payer: BC Managed Care – PPO | Admitting: Physician Assistant

## 2022-01-19 ENCOUNTER — Other Ambulatory Visit: Payer: Self-pay | Admitting: Physician Assistant

## 2022-01-20 ENCOUNTER — Telehealth: Payer: Self-pay

## 2022-01-20 NOTE — Telephone Encounter (Signed)
Appointment made for Wednesday 01/27/22.

## 2022-01-20 NOTE — Telephone Encounter (Signed)
Patient called and stated she can't find her rybelsus, she called [facility] and they told her she has already picked it up. She don't know what to do. Please advise.

## 2022-01-20 NOTE — Telephone Encounter (Signed)
Pt was supposed to come in 2 weeks ago for bp check and did not come She was due to come in as well for fasting follow up and has not come to do that As far as her medication I can send in more but she will have to pay out of pocket if she has already picked up the prescription because insurance will not cover again - where does she want rx sent? She needs to schedule appt

## 2022-01-21 ENCOUNTER — Other Ambulatory Visit: Payer: Self-pay | Admitting: Physician Assistant

## 2022-01-27 ENCOUNTER — Encounter: Payer: Self-pay | Admitting: Physician Assistant

## 2022-01-27 ENCOUNTER — Other Ambulatory Visit: Payer: Self-pay | Admitting: Physician Assistant

## 2022-01-27 ENCOUNTER — Ambulatory Visit (INDEPENDENT_AMBULATORY_CARE_PROVIDER_SITE_OTHER): Payer: BC Managed Care – PPO | Admitting: Physician Assistant

## 2022-01-27 VITALS — BP 130/80 | HR 72 | Temp 97.1°F | Ht 64.0 in | Wt 241.0 lb

## 2022-01-27 DIAGNOSIS — I10 Essential (primary) hypertension: Secondary | ICD-10-CM | POA: Diagnosis not present

## 2022-01-27 DIAGNOSIS — E782 Mixed hyperlipidemia: Secondary | ICD-10-CM

## 2022-01-27 MED ORDER — RYBELSUS 3 MG PO TABS: ORAL_TABLET | ORAL | 0 refills | Status: DC

## 2022-01-27 NOTE — Progress Notes (Signed)
Subjective:  Patient ID: Kelly Wilkins, female    DOB: 06-15-1975  Age: 46 y.o. MRN: [id number]  Chief Complaint  Patient presents with   Hypertension    HPI  Pt presents for follow up of hypertension.  The patient is tolerating the medication well without side effects. Compliance with treatment has been good; including taking medication as directed , maintains a healthy diet and regular exercise regimen , and following up as directed. She is currently taking norvasc '10mg'$ , lopressor '100mg'$  and recently added benicar '20mg'$  qd - bp has been stable  Pt with history of GERD - stable on prilosec '40mg'$  qd  Pt with history of NIDDM - her last hgb A1c was one month ago and was 7.8 .  However pt had been on rybelsus and lost medication - will not be able to pick up rx from pharmacy until after 02/15/22.  She continues on farxiga '10mg'$  and glucophage '850mg'$  -  Current Outpatient Medications on File Prior to Visit  Medication Sig Dispense Refill   albuterol (VENTOLIN HFA) 108 (90 Base) MCG/ACT inhaler Inhale 2 puffs into the lungs every 6 (six) hours as needed for wheezing or shortness of breath. 8 g 2   amLODipine (NORVASC) 10 MG tablet Take 1 tablet by mouth once daily 90 tablet 0   atorvastatin (LIPITOR) 10 MG tablet Take 1 tablet by mouth once daily 30 tablet 0   dapagliflozin propanediol (FARXIGA) 10 MG TABS tablet Take 1 tablet by mouth once daily 30 tablet 2   gabapentin (NEURONTIN) 300 MG capsule 1 po 5 times daily 150 capsule 0   metFORMIN (GLUCOPHAGE) 850 MG tablet TAKE 1 TABLET BY MOUTH TWICE DAILY WITH A MEAL 60 tablet 0   metoprolol tartrate (LOPRESSOR) 100 MG tablet Take 1 tablet by mouth twice daily 180 tablet 0   olmesartan (BENICAR) 20 MG tablet Take 1 tablet (20 mg total) by mouth daily. 30 tablet 2   omeprazole (PRILOSEC) 40 MG capsule Take 1 capsule by mouth once daily 90 capsule 0   rOPINIRole (REQUIP) 2 MG tablet TAKE 1 TABLET BY MOUTH AT BEDTIME 90 tablet 0   No current  facility-administered medications on file prior to visit.   Past Medical History:  Diagnosis Date   Diabetes mellitus without complication ([place])    Renal cancer ([place])    Past Surgical History:  Procedure Laterality Date   ABDOMINAL HYSTERECTOMY     CHOLECYSTECTOMY     NEPHRECTOMY     TONSILLECTOMY      Family History  Problem Relation Age of Onset   Hypertension Maternal Grandmother    Diabetes Maternal Grandmother    Hypertension Maternal Grandfather    Diabetes Maternal Grandfather    Hypertension Paternal Grandmother    Diabetes Paternal Grandmother    Hypertension Paternal Grandfather    Social History   Socioeconomic History   Marital status: Married    Spouse name: Not on file   Number of children: Not on file   Years of education: Not on file   Highest education level: Not on file  Occupational History   Not on file  Tobacco Use   Smoking status: Former    Years: 10.00    Types: Cigarettes   Smokeless tobacco: Not on file   Tobacco comments:    Stopped 12 years ago  Substance and Sexual Activity   Alcohol use: Yes    Comment: occasional   Drug use: No   Sexual activity: Not on file  Other Topics Concern   Not on file  Social History Narrative   Not on file   Social Determinants of Health   Financial Resource Strain: Not on file  Food Insecurity: Not on file  Transportation Needs: Not on file  Physical Activity: Not on file  Stress: Not on file  Social Connections: Not on file    Review of Systems CONSTITUTIONAL: Negative for chills, fatigue, fever, unintentional weight gain and unintentional weight loss.  CARDIOVASCULAR: Negative for chest pain, dizziness, palpitations and pedal edema.  RESPIRATORY: Negative for recent cough and dyspnea.  GASTROINTESTINAL: Negative for abdominal pain, acid reflux symptoms, constipation, diarrhea, nausea and vomiting.  INTEGUMENTARY: Negative for rash.        Objective:  PHYSICAL EXAM:   VS: BP 130/80  (BP Location: Left Arm, Patient Position: Sitting, Cuff Size: Large)   Pulse 72   Temp (!) 97.1 F (36.2 C) (Temporal)   Ht '5\' 4"'$  (1.626 m)   Wt 241 lb (109.3 kg)   SpO2 97%   BMI 41.37 kg/m   GEN: Well nourished, well developed, in no acute distress  Cardiac: RRR; no murmurs, rubs, or gallops,no edema -  Respiratory:  normal respiratory rate and pattern with no distress - normal breath sounds with no rales, rhonchi, wheezes or rubs Skin: warm and dry, no rash  Psych: euthymic mood, appropriate affect and demeanor   Diabetic Foot Exam - Simple   Simple Foot Form Diabetic Foot exam was performed with the following findings: Yes 01/27/2022  8:40 AM  Visual Inspection No deformities, no ulcerations, no other skin breakdown bilaterally: Yes Sensation Testing Intact to touch and monofilament testing bilaterally: Yes Pulse Check Posterior Tibialis and Dorsalis pulse intact bilaterally: Yes Comments      Lab Results  Component Value Date   WBC 9.9 12/18/2021   HGB 15.5 12/18/2021   HCT 45.7 12/18/2021   PLT 312 12/18/2021   GLUCOSE 221 (H) 12/18/2021   CHOL 163 06/03/2021   TRIG 175 (H) 06/03/2021   HDL 49 06/03/2021   LDLCALC 84 06/03/2021   ALT 42 (H) 12/18/2021   AST 35 12/18/2021   NA 140 12/18/2021   K 5.5 (H) 12/18/2021   CL 99 12/18/2021   CREATININE 0.84 12/18/2021   BUN 20 12/18/2021   CO2 22 12/18/2021   TSH 1.090 12/18/2021   HGBA1C 7.8 (H) 12/18/2021      Assessment & Plan:   Problem List Items Addressed This Visit       Cardiovascular and Mediastinum   Benign hypertension - Primary   Relevant Orders   Comprehensive metabolic panel   Lipid panel Continue current meds as directed   Other Visit Diagnoses     Mixed hyperlipidemia       Relevant Orders   Comprehensive metabolic panel   Lipid panel Watch diet  NIDDM Continue current meds Given sample of rybelsus to take '3mg'$  2 po qd until she can get her rx filled     .  Meds ordered  this encounter  Medications   Semaglutide (RYBELSUS) 3 MG TABS    Sig: 2 po qd    Dispense:  30 tablet    Refill:  0    Order Specific Question:   Supervising Provider    AnswerShelton Silvas    Orders Placed This Encounter  Procedures   Comprehensive metabolic panel   Lipid panel     Follow-up: Return in about 3 months (around 04/29/2022) for chronic  fasting follow up.  An After Visit Summary was printed and given to the patient.  Yetta Flock [facility] [phone]

## 2022-01-28 LAB — LIPID PANEL
Chol/HDL Ratio: 4.1 ratio (ref 0.0–4.4)
Cholesterol, Total: 191 mg/dL (ref 100–199)
HDL: 47 mg/dL (ref >39)
LDL Chol Calc (NIH): 110 mg/dL — ABNORMAL HIGH (ref 0–99)
Triglycerides: 194 mg/dL — ABNORMAL HIGH (ref 0–149)
VLDL Cholesterol Cal: 34 mg/dL (ref 5–40)

## 2022-01-28 LAB — COMPREHENSIVE METABOLIC PANEL
ALT: 39 IU/L — ABNORMAL HIGH (ref 0–32)
AST: 36 IU/L (ref 0–40)
Albumin/Globulin Ratio: 1.6 (ref 1.2–2.2)
Albumin: 4.3 g/dL (ref 3.9–4.9)
Alkaline Phosphatase: 134 IU/L — ABNORMAL HIGH (ref 44–121)
BUN/Creatinine Ratio: 23 (ref 9–23)
BUN: 18 mg/dL (ref 6–24)
Bilirubin Total: 0.4 mg/dL (ref 0.0–1.2)
CO2: 20 mmol/L (ref 20–29)
Calcium: 9.6 mg/dL (ref 8.7–10.2)
Chloride: 101 mmol/L (ref 96–106)
Creatinine, Ser: 0.78 mg/dL (ref 0.57–1.00)
Globulin, Total: 2.7 g/dL (ref 1.5–4.5)
Glucose: 179 mg/dL — ABNORMAL HIGH (ref 70–99)
Potassium: 5.6 mmol/L — ABNORMAL HIGH (ref 3.5–5.2)
Sodium: 137 mmol/L (ref 134–144)
Total Protein: 7 g/dL (ref 6.0–8.5)
eGFR: 94 mL/min/{1.73_m2} (ref >59)

## 2022-01-28 LAB — CARDIOVASCULAR RISK ASSESSMENT

## 2022-02-03 ENCOUNTER — Other Ambulatory Visit: Payer: Self-pay | Admitting: Physician Assistant

## 2022-02-03 DIAGNOSIS — I10 Essential (primary) hypertension: Secondary | ICD-10-CM

## 2022-02-04 ENCOUNTER — Other Ambulatory Visit: Payer: BC Managed Care – PPO

## 2022-02-04 DIAGNOSIS — E875 Hyperkalemia: Secondary | ICD-10-CM

## 2022-02-05 ENCOUNTER — Other Ambulatory Visit: Payer: Self-pay | Admitting: Physician Assistant

## 2022-02-05 DIAGNOSIS — R899 Unspecified abnormal finding in specimens from other organs, systems and tissues: Secondary | ICD-10-CM

## 2022-02-05 LAB — COMPREHENSIVE METABOLIC PANEL
ALT: 35 IU/L — ABNORMAL HIGH (ref 0–32)
AST: 24 IU/L (ref 0–40)
Albumin/Globulin Ratio: 1.8 (ref 1.2–2.2)
Albumin: 4.3 g/dL (ref 3.9–4.9)
Alkaline Phosphatase: 115 IU/L (ref 44–121)
BUN/Creatinine Ratio: 23 (ref 9–23)
BUN: 28 mg/dL — ABNORMAL HIGH (ref 6–24)
Bilirubin Total: 0.3 mg/dL (ref 0.0–1.2)
CO2: 20 mmol/L (ref 20–29)
Calcium: 10 mg/dL (ref 8.7–10.2)
Chloride: 103 mmol/L (ref 96–106)
Creatinine, Ser: 1.21 mg/dL — ABNORMAL HIGH (ref 0.57–1.00)
Globulin, Total: 2.4 g/dL (ref 1.5–4.5)
Glucose: 175 mg/dL — ABNORMAL HIGH (ref 70–99)
Potassium: 5.9 mmol/L — ABNORMAL HIGH (ref 3.5–5.2)
Sodium: 141 mmol/L (ref 134–144)
Total Protein: 6.7 g/dL (ref 6.0–8.5)
eGFR: 55 mL/min/{1.73_m2} — ABNORMAL LOW (ref >59)

## 2022-02-10 ENCOUNTER — Other Ambulatory Visit: Payer: Self-pay | Admitting: Physician Assistant

## 2022-02-10 DIAGNOSIS — E1029 Type 1 diabetes mellitus with other diabetic kidney complication: Secondary | ICD-10-CM

## 2022-02-11 ENCOUNTER — Other Ambulatory Visit: Payer: BC Managed Care – PPO

## 2022-02-14 ENCOUNTER — Other Ambulatory Visit: Payer: Self-pay | Admitting: Physician Assistant

## 2022-02-18 ENCOUNTER — Other Ambulatory Visit: Payer: Self-pay | Admitting: Physician Assistant

## 2022-02-20 ENCOUNTER — Other Ambulatory Visit: Payer: Self-pay | Admitting: Physician Assistant

## 2022-02-26 ENCOUNTER — Other Ambulatory Visit: Payer: Self-pay | Admitting: Physician Assistant

## 2022-03-09 ENCOUNTER — Other Ambulatory Visit: Payer: Self-pay | Admitting: Physician Assistant

## 2022-03-09 DIAGNOSIS — I10 Essential (primary) hypertension: Secondary | ICD-10-CM

## 2022-03-19 ENCOUNTER — Other Ambulatory Visit: Payer: Self-pay | Admitting: Physician Assistant

## 2022-03-24 ENCOUNTER — Other Ambulatory Visit: Payer: Self-pay | Admitting: Physician Assistant

## 2022-03-24 DIAGNOSIS — G2581 Restless legs syndrome: Secondary | ICD-10-CM

## 2022-03-24 DIAGNOSIS — I10 Essential (primary) hypertension: Secondary | ICD-10-CM

## 2022-04-04 ENCOUNTER — Other Ambulatory Visit: Payer: Self-pay | Admitting: Physician Assistant

## 2022-04-04 DIAGNOSIS — I10 Essential (primary) hypertension: Secondary | ICD-10-CM

## 2022-04-12 ENCOUNTER — Other Ambulatory Visit: Payer: Self-pay | Admitting: Physician Assistant

## 2022-04-12 DIAGNOSIS — I10 Essential (primary) hypertension: Secondary | ICD-10-CM

## 2022-04-13 ENCOUNTER — Other Ambulatory Visit: Payer: Self-pay | Admitting: Physician Assistant

## 2022-04-14 ENCOUNTER — Other Ambulatory Visit: Payer: Self-pay | Admitting: Physician Assistant

## 2022-04-17 ENCOUNTER — Other Ambulatory Visit: Payer: Self-pay | Admitting: Physician Assistant

## 2022-04-19 ENCOUNTER — Ambulatory Visit: Payer: BC Managed Care – PPO | Admitting: Physician Assistant

## 2022-04-19 ENCOUNTER — Encounter: Payer: Self-pay | Admitting: Physician Assistant

## 2022-04-19 VITALS — BP 118/70 | HR 88 | Temp 97.0°F | Ht 64.0 in | Wt 233.0 lb

## 2022-04-19 DIAGNOSIS — R1110 Vomiting, unspecified: Secondary | ICD-10-CM | POA: Diagnosis not present

## 2022-04-19 DIAGNOSIS — R197 Diarrhea, unspecified: Secondary | ICD-10-CM | POA: Diagnosis not present

## 2022-04-19 LAB — COMPREHENSIVE METABOLIC PANEL
ALT: 107 IU/L — ABNORMAL HIGH (ref 0–32)
AST: 66 IU/L — ABNORMAL HIGH (ref 0–40)
Albumin/Globulin Ratio: 1.7 (ref 1.2–2.2)
Albumin: 4.5 g/dL (ref 3.9–4.9)
Alkaline Phosphatase: 119 IU/L (ref 44–121)
BUN/Creatinine Ratio: 13 (ref 9–23)
BUN: 17 mg/dL (ref 6–24)
Bilirubin Total: 0.7 mg/dL (ref 0.0–1.2)
CO2: 16 mmol/L — ABNORMAL LOW (ref 20–29)
Calcium: 10 mg/dL (ref 8.7–10.2)
Chloride: 104 mmol/L (ref 96–106)
Creatinine, Ser: 1.27 mg/dL — ABNORMAL HIGH (ref 0.57–1.00)
Globulin, Total: 2.6 g/dL (ref 1.5–4.5)
Glucose: 195 mg/dL — ABNORMAL HIGH (ref 70–99)
Potassium: 5.5 mmol/L — ABNORMAL HIGH (ref 3.5–5.2)
Sodium: 138 mmol/L (ref 134–144)
Total Protein: 7.1 g/dL (ref 6.0–8.5)
eGFR: 52 mL/min/{1.73_m2} — ABNORMAL LOW (ref >59)

## 2022-04-19 LAB — CBC WITH DIFFERENTIAL/PLATELET
Basophils Absolute: 0.1 10*3/uL (ref 0.0–0.2)
Basos: 1 %
EOS (ABSOLUTE): 0 10*3/uL (ref 0.0–0.4)
Eos: 0 %
Hematocrit: 46.9 % — ABNORMAL HIGH (ref 34.0–46.6)
Hemoglobin: 16 g/dL — ABNORMAL HIGH (ref 11.1–15.9)
Immature Grans (Abs): 0 10*3/uL (ref 0.0–0.1)
Immature Granulocytes: 0 %
Lymphocytes Absolute: 2.2 10*3/uL (ref 0.7–3.1)
Lymphs: 24 %
MCH: 29 pg (ref 26.6–33.0)
MCHC: 34.1 g/dL (ref 31.5–35.7)
MCV: 85 fL (ref 79–97)
Monocytes Absolute: 0.9 10*3/uL (ref 0.1–0.9)
Monocytes: 9 %
Neutrophils Absolute: 6.1 10*3/uL (ref 1.4–7.0)
Neutrophils: 66 %
Platelets: 422 10*3/uL (ref 150–450)
RBC: 5.51 x10E6/uL — ABNORMAL HIGH (ref 3.77–5.28)
RDW: 12.6 % (ref 11.7–15.4)
WBC: 9.3 10*3/uL (ref 3.4–10.8)

## 2022-04-19 LAB — POC COVID19 BINAXNOW: SARS Coronavirus 2 Ag: NEGATIVE

## 2022-04-19 NOTE — Progress Notes (Signed)
Acute Office Visit  Subjective:    Patient ID: Kelly Wilkins, female    DOB: 1975-08-03, 47 y.o.   MRN: [id number]  Chief Complaint  Patient presents with   Emesis   Diarrhea    HPI Patient is in today for complaints of vomiting and diarrhea Pt states that she left work last Wednesday because she was having diarrhea.  On Thursday she started vomiting.  Over the weekend she has had decreased appetite and vomited a few times on Saturday after trying to eat and twice yesterday after eating.  She has not vomited today. She has had loose bowels that look like 'runny peanut butter' - she denies melena or hematochezia.  She states that she was having bms/diarrhea in her sleep last night 6 times and did not wake up with urge to go and had to take 6 showers She denies abdominal pain or fever Husband with similar symptoms  Past Medical History:  Diagnosis Date   Diabetes mellitus without complication ([place])    Renal cancer ([place])     Past Surgical History:  Procedure Laterality Date   ABDOMINAL HYSTERECTOMY     CHOLECYSTECTOMY     NEPHRECTOMY     TONSILLECTOMY      Family History  Problem Relation Age of Onset   Hypertension Maternal Grandmother    Diabetes Maternal Grandmother    Hypertension Maternal Grandfather    Diabetes Maternal Grandfather    Hypertension Paternal Grandmother    Diabetes Paternal Grandmother    Hypertension Paternal Grandfather     Social History   Socioeconomic History   Marital status: Married    Spouse name: Not on file   Number of children: Not on file   Years of education: Not on file   Highest education level: Not on file  Occupational History   Not on file  Tobacco Use   Smoking status: Former    Years: 10.00    Types: Cigarettes   Smokeless tobacco: Not on file   Tobacco comments:    Stopped 12 years ago  Substance and Sexual Activity   Alcohol use: Yes    Comment: occasional   Drug use: No   Sexual activity: Not on file  Other  Topics Concern   Not on file  Social History Narrative   Not on file   Social Determinants of Health   Financial Resource Strain: Not on file  Food Insecurity: Not on file  Transportation Needs: Not on file  Physical Activity: Not on file  Stress: Not on file  Social Connections: Not on file  Intimate Partner Violence: Not on file     Current Outpatient Medications:    albuterol (VENTOLIN HFA) 108 (90 Base) MCG/ACT inhaler, Inhale 2 puffs into the lungs every 6 (six) hours as needed for wheezing or shortness of breath., Disp: 8 g, Rfl: 2   amLODipine (NORVASC) 10 MG tablet, Take 1 tablet by mouth once daily, Disp: 90 tablet, Rfl: 0   atorvastatin (LIPITOR) 10 MG tablet, Take 1 tablet by mouth once daily, Disp: 30 tablet, Rfl: 0   FARXIGA 10 MG TABS tablet, Take 1 tablet by mouth once daily, Disp: 30 tablet, Rfl: 0   gabapentin (NEURONTIN) 300 MG capsule, TAKE 1 CAPSULE BY MOUTH FIVE TIMES DAILY, Disp: 150 capsule, Rfl: 0   metFORMIN (GLUCOPHAGE) 850 MG tablet, TAKE 1 TABLET BY MOUTH TWICE DAILY WITH A MEAL, Disp: 60 tablet, Rfl: 0   metoprolol tartrate (LOPRESSOR) 100 MG tablet, Take 1  tablet by mouth twice daily, Disp: 30 tablet, Rfl: 0   olmesartan (BENICAR) 20 MG tablet, Take 1 tablet by mouth once daily, Disp: 90 tablet, Rfl: 0   omeprazole (PRILOSEC) 40 MG capsule, Take 1 capsule by mouth once daily, Disp: 90 capsule, Rfl: 0   ondansetron (ZOFRAN-ODT) 4 MG disintegrating tablet, Take by mouth., Disp: , Rfl:    rOPINIRole (REQUIP) 2 MG tablet, TAKE 1 TABLET BY MOUTH AT BEDTIME, Disp: 90 tablet, Rfl: 0   Semaglutide (RYBELSUS) 7 MG TABS, Take 1 tablet by mouth once daily, Disp: 90 tablet, Rfl: 0   Allergies  Allergen Reactions   Ibuprofen Other (See Comments) and Nausea And Vomiting    Due to history of kidney cancer Other reaction(s): Other (See Comments) Due to history of kidney cancer Other reaction(s): Other (See Comments) Other reaction(s): Other (See Comments) Due to  history of kidney cancer Due to history of kidney cancer Due to history of kidney cancer Other reaction(s): Other (See Comments) Due to history of kidney cancer   Lisinopril Cough   Codeine Rash   Penicillins Nausea And Vomiting and Rash   Sulfa Antibiotics Nausea And Vomiting and Rash    CONSTITUTIONAL: Negative for chills, fatigue, fever, E/N/T: Negative for ear pain, nasal congestion and sore throat.  CARDIOVASCULAR: Negative for chest pain,  RESPIRATORY: Negative for recent cough and dyspnea.  GASTROINTESTINAL: see HPI      Objective:    PHYSICAL EXAM:   VS: BP 118/70   Pulse 88   Temp (!) 97 F (36.1 C) (Temporal)   Ht 5' 4"$  (1.626 m)   Wt 233 lb (105.7 kg)   SpO2 97%   BMI 39.99 kg/m   GEN: Well nourished, well developed, in no acute distress - appears hydrated -  ENT - mucous membranes moist Cardiac: RRR; no murmurs, rubs,  Respiratory:  normal respiratory rate and pattern with no distress - normal breath sounds with no rales, rhonchi, wheezes or rubs GI: normal bowel sounds, no masses or tenderness Skin: warm and dry, no rash   Office Visit on 04/19/2022  Component Date Value Ref Range Status   SARS Coronavirus 2 Ag 04/19/2022 Negative  Negative Corrected      Wt Readings from Last 3 Encounters:  04/19/22 233 lb (105.7 kg)  01/27/22 241 lb (109.3 kg)  12/18/21 239 lb 6.4 oz (108.6 kg)    Health Maintenance Due  Topic Date Due   OPHTHALMOLOGY EXAM  Never done    There are no preventive care reminders to display for this patient.        Assessment & Plan:   Problem List Items Addressed This Visit   None Visit Diagnoses     Vomiting and diarrhea    -  Primary   Relevant Orders   POC COVID-19 BinaxNow (Completed)   CBC with Differential/Platelet   Comprehensive metabolic panel   Cdiff NAA+O+P+Stool Culture   Diarrhea of presumed infectious origin       Relevant Orders   CBC with Differential/Platelet   Comprehensive metabolic panel    Cdiff NAA+O+P+Stool Culture  Recommend clear liquids and bland diet Pt has zofran and immodium at home to use for symptoms        No orders of the defined types were placed in this encounter.    SARA R Yuan Gann, PA-C

## 2022-04-20 ENCOUNTER — Other Ambulatory Visit: Payer: Self-pay

## 2022-04-20 ENCOUNTER — Other Ambulatory Visit: Payer: Self-pay | Admitting: Physician Assistant

## 2022-04-20 DIAGNOSIS — R748 Abnormal levels of other serum enzymes: Secondary | ICD-10-CM

## 2022-04-20 NOTE — Telephone Encounter (Signed)
Kelly Wilkins called to report that she has had two episodes of vomiting since her visit yesterday.  She has returned her stool studies.  She was notified of lab results and instructed to increase her bland diet slowly.  Hepatitis panel pending. Cotinue zofran as instructed as needed nausea and vomiting.

## 2022-04-21 ENCOUNTER — Telehealth: Payer: Self-pay

## 2022-04-21 NOTE — Telephone Encounter (Signed)
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  Provider made aware, will call patient with results.

## 2022-04-22 LAB — HCV INTERPRETATION

## 2022-04-22 LAB — ACUTE VIRAL HEPATITIS (HAV, HBV, HCV)
HCV Ab: NONREACTIVE
Hep A IgM: NEGATIVE
Hep B C IgM: NEGATIVE
Hepatitis B Surface Ag: NEGATIVE

## 2022-04-22 LAB — SPECIMEN STATUS REPORT

## 2022-04-23 ENCOUNTER — Encounter: Payer: Self-pay | Admitting: Physician Assistant

## 2022-04-23 ENCOUNTER — Other Ambulatory Visit: Payer: Self-pay | Admitting: Physician Assistant

## 2022-04-23 DIAGNOSIS — R899 Unspecified abnormal finding in specimens from other organs, systems and tissues: Secondary | ICD-10-CM

## 2022-04-23 LAB — CDIFF NAA+O+P+STOOL CULTURE
E coli, Shiga toxin Assay: NEGATIVE
Toxigenic C. Difficile by PCR: NEGATIVE

## 2022-04-29 ENCOUNTER — Other Ambulatory Visit: Payer: Self-pay | Admitting: Physician Assistant

## 2022-04-29 DIAGNOSIS — I10 Essential (primary) hypertension: Secondary | ICD-10-CM

## 2022-05-03 ENCOUNTER — Ambulatory Visit: Payer: BC Managed Care – PPO | Admitting: Physician Assistant

## 2022-05-03 ENCOUNTER — Encounter: Payer: Self-pay | Admitting: Physician Assistant

## 2022-05-03 VITALS — BP 108/70 | HR 78 | Temp 96.1°F | Ht 64.0 in | Wt 238.0 lb

## 2022-05-03 DIAGNOSIS — G629 Polyneuropathy, unspecified: Secondary | ICD-10-CM

## 2022-05-03 DIAGNOSIS — E1165 Type 2 diabetes mellitus with hyperglycemia: Secondary | ICD-10-CM

## 2022-05-03 DIAGNOSIS — I10 Essential (primary) hypertension: Secondary | ICD-10-CM | POA: Diagnosis not present

## 2022-05-03 DIAGNOSIS — E782 Mixed hyperlipidemia: Secondary | ICD-10-CM

## 2022-05-03 DIAGNOSIS — K219 Gastro-esophageal reflux disease without esophagitis: Secondary | ICD-10-CM

## 2022-05-03 DIAGNOSIS — Z1231 Encounter for screening mammogram for malignant neoplasm of breast: Secondary | ICD-10-CM

## 2022-05-03 DIAGNOSIS — Z85528 Personal history of other malignant neoplasm of kidney: Secondary | ICD-10-CM

## 2022-05-03 DIAGNOSIS — G2581 Restless legs syndrome: Secondary | ICD-10-CM

## 2022-05-03 MED ORDER — GABAPENTIN 300 MG PO CAPS: ORAL_CAPSULE | ORAL | 2 refills | Status: DC

## 2022-05-03 MED ORDER — AMLODIPINE BESYLATE 10 MG PO TABS: 10.0000 mg | ORAL_TABLET | Freq: Every day | ORAL | 1 refills | Status: DC

## 2022-05-03 MED ORDER — DAPAGLIFLOZIN PROPANEDIOL 10 MG PO TABS: 10.0000 mg | ORAL_TABLET | Freq: Every day | ORAL | 1 refills | Status: DC

## 2022-05-03 MED ORDER — METFORMIN HCL 850 MG PO TABS: 850.0000 mg | ORAL_TABLET | Freq: Two times a day (BID) | ORAL | 1 refills | Status: DC

## 2022-05-03 MED ORDER — OMEPRAZOLE 40 MG PO CPDR: 40.0000 mg | DELAYED_RELEASE_CAPSULE | Freq: Every day | ORAL | 1 refills | Status: DC

## 2022-05-03 MED ORDER — ROPINIROLE HCL 2 MG PO TABS: 2.0000 mg | ORAL_TABLET | Freq: Every day | ORAL | 1 refills | Status: DC

## 2022-05-03 MED ORDER — ATORVASTATIN CALCIUM 10 MG PO TABS: 10.0000 mg | ORAL_TABLET | Freq: Every day | ORAL | 1 refills | Status: DC

## 2022-05-03 MED ORDER — METOPROLOL TARTRATE 100 MG PO TABS: 100.0000 mg | ORAL_TABLET | Freq: Two times a day (BID) | ORAL | 1 refills | Status: DC

## 2022-05-03 MED ORDER — OLMESARTAN MEDOXOMIL 20 MG PO TABS: 20.0000 mg | ORAL_TABLET | Freq: Every day | ORAL | 1 refills | Status: DC

## 2022-05-03 NOTE — Progress Notes (Signed)
Subjective:  Patient ID: Kelly Wilkins, female    DOB: 1975/12/07  Age: 47 y.o. MRN: [id number]  Chief Complaint  Patient presents with   Diabetes   Hypertension    Diabetes  Hypertension    Pt presents for follow up of hypertension.  The patient is tolerating the medication well without side effects. Compliance with treatment has been good; including taking medication as directed , maintains a healthy diet and regular exercise regimen , and following up as directed. She is currently taking norvasc '10mg'$ , lopressor '100mg'$  and benicar '20mg'$  qd - bp has been stable and today 108/70 - denies chest pain or dyspnea  Pt with history of GERD - stable on prilosec '40mg'$  qd She recently had GI virus and symptoms have  resolved  Pt with history of NIDDM - her last hgb A1c was four months ago and was 7.8 .  She is currently taking farxiga '10mg'$ , glucophage '850mg'$ , and rybelsus '7mg'$  qd  Pt with history of renal cancer in 2012.  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    Pt would like to schedule screening mammogram Current Outpatient Medications on File Prior to Visit  Medication Sig Dispense Refill   albuterol (VENTOLIN HFA) 108 (90 Base) MCG/ACT inhaler Inhale 2 puffs into the lungs every 6 (six) hours as needed for wheezing or shortness of breath. 8 g 2   ondansetron (ZOFRAN-ODT) 4 MG disintegrating tablet Take by mouth.     Semaglutide (RYBELSUS) 7 MG TABS Take 1 tablet by mouth once daily 90 tablet 0   No current facility-administered medications on file prior to visit.   Past Medical History:  Diagnosis Date   Diabetes mellitus without complication ([place])    Renal cancer ([place])    Past Surgical History:  Procedure Laterality Date   ABDOMINAL HYSTERECTOMY     CHOLECYSTECTOMY     NEPHRECTOMY     TONSILLECTOMY      Family History  Problem Relation Age of Onset   Hypertension Maternal Grandmother    Diabetes Maternal  Grandmother    Hypertension Maternal Grandfather    Diabetes Maternal Grandfather    Hypertension Paternal Grandmother    Diabetes Paternal Grandmother    Hypertension Paternal Grandfather    Social History   Socioeconomic History   Marital status: Married    Spouse name: Not on file   Number of children: Not on file   Years of education: Not on file   Highest education level: Not on file  Occupational History   Not on file  Tobacco Use   Smoking status: Former    Years: 10.00    Types: Cigarettes   Smokeless tobacco: Not on file   Tobacco comments:    Stopped 12 years ago  Substance and Sexual Activity   Alcohol use: Yes    Comment: occasional   Drug use: No   Sexual activity: Not on file  Other Topics Concern   Not on file  Social History Narrative   Not on file   Social Determinants of Health   Financial Resource Strain: Not on file  Food Insecurity: Not on file  Transportation Needs: Not on file  Physical Activity: Not on file  Stress: Not on file  Social Connections: Not on file    CONSTITUTIONAL: Negative for chills, fatigue, fever, unintentional weight gain and unintentional weight loss.  E/N/T: Negative for ear pain, nasal congestion and sore throat.  CARDIOVASCULAR: Negative for  chest pain, dizziness, palpitations and pedal edema.  RESPIRATORY: Negative for recent cough and dyspnea.  GASTROINTESTINAL: Negative for abdominal pain, acid reflux symptoms, constipation, diarrhea, nausea and vomiting.  MSK: Negative for arthralgias and myalgias.  INTEGUMENTARY: Negative for rash.  NEUROLOGICAL: Negative for dizziness and headaches.  PSYCHIATRIC: Negative for sleep disturbance and to question depression screen.  Negative for depression, negative for anhedonia.        Objective:  PHYSICAL EXAM:   VS: BP 108/70 (BP Location: Left Arm, Patient Position: Sitting, Cuff Size: Large)   Pulse 78   Temp (!) 96.1 F (35.6 C) (Temporal)   Ht '5\' 4"'$  (1.626 m)    Wt 238 lb (108 kg)   SpO2 97%   BMI 40.85 kg/m   GEN: Well nourished, well developed, in no acute distress  Cardiac: RRR; no murmurs, rubs, or gallops,no edema - Respiratory:  normal respiratory rate and pattern with no distress - normal breath sounds with no rales, rhonchi, wheezes or rubs MS: no deformity or atrophy  Skin: warm and dry, no rash  Psych: euthymic mood, appropriate affect and demeanor   Lab Results  Component Value Date   WBC 9.3 04/19/2022   HGB 16.0 (H) 04/19/2022   HCT 46.9 (H) 04/19/2022   PLT 422 04/19/2022   GLUCOSE 195 (H) 04/19/2022   CHOL 191 01/27/2022   TRIG 194 (H) 01/27/2022   HDL 47 01/27/2022   LDLCALC 110 (H) 01/27/2022   ALT 107 (H) 04/19/2022   AST 66 (H) 04/19/2022   NA 138 04/19/2022   K 5.5 (H) 04/19/2022   CL 104 04/19/2022   CREATININE 1.27 (H) 04/19/2022   BUN 17 04/19/2022   CO2 16 (L) 04/19/2022   TSH 1.090 12/18/2021   HGBA1C 7.8 (H) 12/18/2021      Assessment & Plan:   Problem List Items Addressed This Visit       Cardiovascular and Mediastinum   Benign hypertension - Primary   Relevant Orders   Comprehensive metabolic panel   Lipid panel Continue current meds as directed   Other Visit Diagnoses     Mixed hyperlipidemia       Relevant Orders   Comprehensive metabolic panel   Lipid panel Watch diet Continue meds  NIDDM Continue current meds Watch diet  History of renal cancer Pt states she has not followed with nephrology in a few years - recommend she make follow up appt or if wants new referral to let us know and will refer  GERD Continue omeprazole '40mg'$  qd   History of neuropathy Continue neurontin as directed     .  Meds ordered this encounter  Medications   amLODipine (NORVASC) 10 MG tablet    Sig: Take 1 tablet (10 mg total) by mouth daily.    Dispense:  90 tablet    Refill:  1    Order Specific Question:   Supervising Provider    Answer:   Shelton Silvas   atorvastatin (LIPITOR)  10 MG tablet    Sig: Take 1 tablet (10 mg total) by mouth daily.    Dispense:  90 tablet    Refill:  1    Order Specific Question:   Supervising Provider    Answer:   Shelton Silvas   dapagliflozin propanediol (FARXIGA) 10 MG TABS tablet    Sig: Take 1 tablet (10 mg total) by mouth daily.    Dispense:  90 tablet    Refill:  1    Order  Specific Question:   Supervising Provider    Answer:   Shelton Silvas   gabapentin (NEURONTIN) 300 MG capsule    Sig: 1 po five times daily    Dispense:  150 capsule    Refill:  2    Order Specific Question:   Supervising Provider    Answer:   Shelton Silvas   metFORMIN (GLUCOPHAGE) 850 MG tablet    Sig: Take 1 tablet (850 mg total) by mouth 2 (two) times daily with a meal.    Dispense:  180 tablet    Refill:  1    Order Specific Question:   Supervising Provider    Answer:   Shelton Silvas   metoprolol tartrate (LOPRESSOR) 100 MG tablet    Sig: Take 1 tablet (100 mg total) by mouth 2 (two) times daily.    Dispense:  180 tablet    Refill:  1    Order Specific Question:   Supervising Provider    Answer:   Shelton Silvas   olmesartan (BENICAR) 20 MG tablet    Sig: Take 1 tablet (20 mg total) by mouth daily.    Dispense:  90 tablet    Refill:  1    Order Specific Question:   Supervising Provider    Answer:   Shelton Silvas   omeprazole (PRILOSEC) 40 MG capsule    Sig: Take 1 capsule (40 mg total) by mouth daily.    Dispense:  90 capsule    Refill:  1    Order Specific Question:   Supervising Provider    Answer:   COX, Lynder Parents   rOPINIRole (REQUIP) 2 MG tablet    Sig: Take 1 tablet (2 mg total) by mouth at bedtime.    Dispense:  90 tablet    Refill:  1    Order Specific Question:   Supervising Provider    AnswerShelton Silvas    Orders Placed This Encounter  Procedures   MM DIGITAL SCREENING BILATERAL   CBC with Differential/Platelet   Comprehensive metabolic panel   Lipid  panel   Hemoglobin A1c     Follow-up: Return in about 4 months (around 09/01/2022) for chronic fasting follow up.  An After Visit Summary was printed and given to the patient.  Yetta Flock [facility] [phone]

## 2022-05-04 ENCOUNTER — Other Ambulatory Visit: Payer: Self-pay | Admitting: Physician Assistant

## 2022-05-04 DIAGNOSIS — R899 Unspecified abnormal finding in specimens from other organs, systems and tissues: Secondary | ICD-10-CM

## 2022-05-04 LAB — LIPID PANEL
Chol/HDL Ratio: 4.2 ratio (ref 0.0–4.4)
Cholesterol, Total: 175 mg/dL (ref 100–199)
HDL: 42 mg/dL (ref >39)
LDL Chol Calc (NIH): 88 mg/dL (ref 0–99)
Triglycerides: 272 mg/dL — ABNORMAL HIGH (ref 0–149)
VLDL Cholesterol Cal: 45 mg/dL — ABNORMAL HIGH (ref 5–40)

## 2022-05-04 LAB — COMPREHENSIVE METABOLIC PANEL
ALT: 37 IU/L — ABNORMAL HIGH (ref 0–32)
AST: 38 IU/L (ref 0–40)
Albumin/Globulin Ratio: 1.7 (ref 1.2–2.2)
Albumin: 4.3 g/dL (ref 3.9–4.9)
Alkaline Phosphatase: 103 IU/L (ref 44–121)
BUN/Creatinine Ratio: 22 (ref 9–23)
BUN: 22 mg/dL (ref 6–24)
Bilirubin Total: 0.5 mg/dL (ref 0.0–1.2)
CO2: 23 mmol/L (ref 20–29)
Calcium: 9.7 mg/dL (ref 8.7–10.2)
Chloride: 99 mmol/L (ref 96–106)
Creatinine, Ser: 0.99 mg/dL (ref 0.57–1.00)
Globulin, Total: 2.5 g/dL (ref 1.5–4.5)
Glucose: 190 mg/dL — ABNORMAL HIGH (ref 70–99)
Potassium: 5.9 mmol/L — ABNORMAL HIGH (ref 3.5–5.2)
Sodium: 138 mmol/L (ref 134–144)
Total Protein: 6.8 g/dL (ref 6.0–8.5)
eGFR: 70 mL/min/{1.73_m2} (ref >59)

## 2022-05-04 LAB — CBC WITH DIFFERENTIAL/PLATELET
Basophils Absolute: 0.1 10*3/uL (ref 0.0–0.2)
Basos: 1 %
EOS (ABSOLUTE): 0.1 10*3/uL (ref 0.0–0.4)
Eos: 1 %
Hematocrit: 41.9 % (ref 34.0–46.6)
Hemoglobin: 13.8 g/dL (ref 11.1–15.9)
Immature Grans (Abs): 0 10*3/uL (ref 0.0–0.1)
Immature Granulocytes: 0 %
Lymphocytes Absolute: 2.1 10*3/uL (ref 0.7–3.1)
Lymphs: 27 %
MCH: 28.9 pg (ref 26.6–33.0)
MCHC: 32.9 g/dL (ref 31.5–35.7)
MCV: 88 fL (ref 79–97)
Monocytes Absolute: 0.6 10*3/uL (ref 0.1–0.9)
Monocytes: 8 %
Neutrophils Absolute: 5 10*3/uL (ref 1.4–7.0)
Neutrophils: 63 %
Platelets: 303 10*3/uL (ref 150–450)
RBC: 4.78 x10E6/uL (ref 3.77–5.28)
RDW: 13 % (ref 11.7–15.4)
WBC: 7.9 10*3/uL (ref 3.4–10.8)

## 2022-05-04 LAB — HEMOGLOBIN A1C
Est. average glucose Bld gHb Est-mCnc: 177 mg/dL
Hgb A1c MFr Bld: 7.8 % — ABNORMAL HIGH (ref 4.8–5.6)

## 2022-05-04 LAB — CARDIOVASCULAR RISK ASSESSMENT

## 2022-05-05 ENCOUNTER — Other Ambulatory Visit: Payer: Self-pay | Admitting: Physician Assistant

## 2022-05-05 DIAGNOSIS — E1165 Type 2 diabetes mellitus with hyperglycemia: Secondary | ICD-10-CM

## 2022-05-05 MED ORDER — RYBELSUS 14 MG PO TABS: 14.0000 mg | ORAL_TABLET | Freq: Every day | ORAL | 2 refills | Status: DC

## 2022-05-11 ENCOUNTER — Other Ambulatory Visit: Payer: BC Managed Care – PPO

## 2022-06-16 ENCOUNTER — Ambulatory Visit
Admission: RE | Admit: 2022-06-16 | Discharge: 2022-06-16 | Disposition: A | Discharge status: Home / Self Care | Payer: BC Managed Care – PPO | Source: Ambulatory Visit | Attending: Physician Assistant | Admitting: Physician Assistant

## 2022-06-16 DIAGNOSIS — Z1231 Encounter for screening mammogram for malignant neoplasm of breast: Secondary | ICD-10-CM

## 2022-06-21 ENCOUNTER — Other Ambulatory Visit: Payer: Self-pay | Admitting: Physician Assistant

## 2022-06-21 DIAGNOSIS — R928 Other abnormal and inconclusive findings on diagnostic imaging of breast: Secondary | ICD-10-CM

## 2022-07-07 ENCOUNTER — Telehealth: Payer: Self-pay

## 2022-07-07 NOTE — Telephone Encounter (Signed)
Patient called and stated that the provider suggest that she try Ozempic at her last appointment and wanted to know if she can go ahead and start Ozempic.  Per Marianne Sofia, PA-C: She recommends patient staying on the rybelsus 14 mg until her next visit and after her labs if she needs to change medication she will let her know.  Patient Made Aware, Verbalized Understanding.

## 2022-07-24 ENCOUNTER — Other Ambulatory Visit: Payer: Self-pay | Admitting: Physician Assistant

## 2022-07-24 DIAGNOSIS — E1029 Type 1 diabetes mellitus with other diabetic kidney complication: Secondary | ICD-10-CM

## 2022-07-26 ENCOUNTER — Telehealth: Payer: Self-pay

## 2022-07-26 NOTE — Telephone Encounter (Signed)
PATIENT CALLED STATING THAT SHE WAS HAVING A FULLNESS SENSATION IN HER CHEST/STOMACH AND STATING THAT SHE WAS HAVING A SHOOTING PAIN UP UP FROM HER CHEST TO HER NECK UP TO THE BACK OF THE HEAD. SHE STATES THAT IT HAS BEEN GOING ON FOR 2-3 DAYS AND YESTERDAY SHE HAD VOMITED AND BEEN DIZZY SINCE THEN. CONSIDERING WE HAVE NO AVAILABLE APPOINTMENTS AND POSSIBILITY OF SEVERE SYMPTOMS TOLD THE PATIENT SHE WOULD NEED TO GO ON TO THE URGENT CARE OR NEAREST ER TO BE SEEN AND EVALUATED AND TOLD NOT TO WAIT. PATIENT AGREED AND STATED SHE WOULD DO EITHER OR.

## 2022-08-05 ENCOUNTER — Other Ambulatory Visit: Payer: Self-pay | Admitting: Physician Assistant

## 2022-08-05 DIAGNOSIS — G629 Polyneuropathy, unspecified: Secondary | ICD-10-CM

## 2022-08-06 ENCOUNTER — Other Ambulatory Visit: Payer: Self-pay

## 2022-08-06 MED ORDER — ONDANSETRON 4 MG PO TBDP: 4.0000 mg | ORAL_TABLET | Freq: Three times a day (TID) | ORAL | 0 refills | Status: DC | PRN

## 2022-08-10 ENCOUNTER — Other Ambulatory Visit: Payer: Self-pay | Admitting: Physician Assistant

## 2022-08-10 DIAGNOSIS — E1165 Type 2 diabetes mellitus with hyperglycemia: Secondary | ICD-10-CM

## 2022-08-19 ENCOUNTER — Ambulatory Visit
Admission: RE | Admit: 2022-08-19 | Discharge: 2022-08-19 | Disposition: A | Discharge status: Home / Self Care | Payer: BC Managed Care – PPO | Source: Ambulatory Visit | Attending: Physician Assistant | Admitting: Physician Assistant

## 2022-08-19 ENCOUNTER — Other Ambulatory Visit: Payer: Self-pay | Admitting: Physician Assistant

## 2022-08-19 DIAGNOSIS — R928 Other abnormal and inconclusive findings on diagnostic imaging of breast: Secondary | ICD-10-CM

## 2022-08-19 DIAGNOSIS — N6489 Other specified disorders of breast: Secondary | ICD-10-CM

## 2022-08-27 ENCOUNTER — Ambulatory Visit
Admission: RE | Admit: 2022-08-27 | Discharge: 2022-08-27 | Disposition: A | Discharge status: Home / Self Care | Payer: BC Managed Care – PPO | Source: Ambulatory Visit | Attending: Physician Assistant | Admitting: Physician Assistant

## 2022-08-27 DIAGNOSIS — N6489 Other specified disorders of breast: Secondary | ICD-10-CM

## 2022-08-27 DIAGNOSIS — R928 Other abnormal and inconclusive findings on diagnostic imaging of breast: Secondary | ICD-10-CM

## 2022-08-27 HISTORY — PX: BREAST BIOPSY: SHX20

## 2022-09-07 ENCOUNTER — Ambulatory Visit: Payer: BC Managed Care – PPO | Admitting: Physician Assistant

## 2022-09-07 ENCOUNTER — Other Ambulatory Visit: Payer: Self-pay | Admitting: Physician Assistant

## 2022-09-07 ENCOUNTER — Encounter: Payer: Self-pay | Admitting: Physician Assistant

## 2022-09-07 VITALS — BP 122/76 | HR 93 | Temp 97.1°F | Resp 16 | Ht 64.0 in | Wt 235.4 lb

## 2022-09-07 DIAGNOSIS — D508 Other iron deficiency anemias: Secondary | ICD-10-CM

## 2022-09-07 DIAGNOSIS — K219 Gastro-esophageal reflux disease without esophagitis: Secondary | ICD-10-CM | POA: Diagnosis not present

## 2022-09-07 DIAGNOSIS — I10 Essential (primary) hypertension: Secondary | ICD-10-CM

## 2022-09-07 DIAGNOSIS — G629 Polyneuropathy, unspecified: Secondary | ICD-10-CM

## 2022-09-07 DIAGNOSIS — E1165 Type 2 diabetes mellitus with hyperglycemia: Secondary | ICD-10-CM | POA: Diagnosis not present

## 2022-09-07 DIAGNOSIS — G2581 Restless legs syndrome: Secondary | ICD-10-CM

## 2022-09-07 DIAGNOSIS — R400 Somnolence: Secondary | ICD-10-CM

## 2022-09-07 DIAGNOSIS — E782 Mixed hyperlipidemia: Secondary | ICD-10-CM

## 2022-09-07 DIAGNOSIS — Z85528 Personal history of other malignant neoplasm of kidney: Secondary | ICD-10-CM

## 2022-09-07 DIAGNOSIS — E559 Vitamin D deficiency, unspecified: Secondary | ICD-10-CM

## 2022-09-07 MED ORDER — ONDANSETRON 4 MG PO TBDP: 4.0000 mg | ORAL_TABLET | Freq: Three times a day (TID) | ORAL | 1 refills | Status: DC | PRN

## 2022-09-07 NOTE — Progress Notes (Signed)
Subjective:  Patient ID: Kelly Wilkins, female    DOB: February 02, 1976  Age: 47 y.o. MRN: [id number]  Chief Complaint  Patient presents with   Medical Management of Chronic Issues    Diabetes  Hypertension    Pt presents for follow up of hypertension.  The patient is tolerating the medication well without side effects. Compliance with treatment has been good; including taking medication as directed , maintains a healthy diet and regular exercise regimen , and following up as directed. She is currently taking norvasc 10mg , lopressor 100mg  and benicar 20mg  qd - bp has been stable and today 122/76- denies chest pain or dyspnea  Pt with GERD - stable on prilosec 40mg  qd  Pt with NIDDM - her last hgb A1c was four months ago and was 7.8 .  She is currently taking farxiga 10mg , glucophage 850mg , and rybelsus 14mg  every day - she does have occasional nausea from rybelsus and requests refill of zofran today  Pt with hyperlipidemia - currently on lipitor 10mg  every day  Pt with history of renal cancer in 2012.  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  She has not seen nephrologist in several years and is agreeable to get appt scheduled for follow up  Pt complains of  loud snoring, trouble sleeping and daytime somnolence.  Would like to have sleep study done Current Outpatient Medications on File Prior to Visit  Medication Sig Dispense Refill   albuterol (VENTOLIN HFA) 108 (90 Base) MCG/ACT inhaler Inhale 2 puffs into the lungs every 6 (six) hours as needed for wheezing or shortness of breath. 8 g 2   amLODipine (NORVASC) 10 MG tablet Take 1 tablet (10 mg total) by mouth daily. 90 tablet 1   atorvastatin (LIPITOR) 10 MG tablet Take 1 tablet (10 mg total) by mouth daily. 90 tablet 1   dapagliflozin propanediol (FARXIGA) 10 MG TABS tablet Take 1 tablet (10 mg total) by mouth daily. 90 tablet 1   gabapentin (NEURONTIN) 300 MG capsule  TAKE 1 CAPSULE BY MOUTH FIVE TIMES DAILY 150 capsule 0   metFORMIN (GLUCOPHAGE) 850 MG tablet Take 1 tablet (850 mg total) by mouth 2 (two) times daily with a meal. 180 tablet 1   metoprolol tartrate (LOPRESSOR) 100 MG tablet Take 1 tablet (100 mg total) by mouth 2 (two) times daily. 180 tablet 1   olmesartan (BENICAR) 20 MG tablet Take 1 tablet (20 mg total) by mouth daily. 90 tablet 1   omeprazole (PRILOSEC) 40 MG capsule Take 1 capsule (40 mg total) by mouth daily. 90 capsule 1   rOPINIRole (REQUIP) 2 MG tablet Take 1 tablet (2 mg total) by mouth at bedtime. 90 tablet 1   No current facility-administered medications on file prior to visit.   Past Medical History:  Diagnosis Date   Diabetes mellitus without complication ([facility])    Renal cancer [facility])    Past Surgical History:  Procedure Laterality Date   ABDOMINAL HYSTERECTOMY     BREAST BIOPSY Left 08/27/2022   MM LT BREAST BX W LOC DEV 1ST LESION IMAGE BX SPEC STEREO GUIDE 08/27/2022 [facility]   BREAST CYST ASPIRATION Bilateral    CHOLECYSTECTOMY     NEPHRECTOMY     TONSILLECTOMY      Family History  Problem Relation Age of Onset   Breast cancer Maternal Aunt    Hypertension Maternal Grandmother    Diabetes Maternal Grandmother    Hypertension Maternal Grandfather  Diabetes Maternal Grandfather    Hypertension Paternal Grandmother    Diabetes Paternal Grandmother    Hypertension Paternal Grandfather    Social History   Socioeconomic History   Marital status: Married    Spouse name: Not on file   Number of children: Not on file   Years of education: Not on file   Highest education level: Not on file  Occupational History   Not on file  Tobacco Use   Smoking status: Former    Years: 10    Types: Cigarettes   Smokeless tobacco: Not on file   Tobacco comments:    Stopped 12 years ago  Substance and Sexual Activity   Alcohol use: Yes    Comment: occasional   Drug use: No   Sexual activity: Not on file   Other Topics Concern   Not on file  Social History Narrative   Not on file   Social Determinants of Health   Financial Resource Strain: Low Risk  (09/07/2022)   Overall Financial Resource Strain (CARDIA)    Difficulty of Paying Living Expenses: Not hard at all  Food Insecurity: No Food Insecurity (09/07/2022)   Hunger Vital Sign    Worried About Running Out of Food in the Last Year: Never true    Ran Out of Food in the Last Year: Never true  Transportation Needs: No Transportation Needs (09/07/2022)   PRAPARE - Administrator, Civil Service (Medical): No    Lack of Transportation (Non-Medical): No  Physical Activity: Sufficiently Active (09/07/2022)   Exercise Vital Sign    Days of Exercise per Week: 6 days    Minutes of Exercise per Session: 30 min  Stress: No Stress Concern Present (09/07/2022)   [employer] of Occupational Health - Occupational Stress Questionnaire    Feeling of Stress : Not at all  Social Connections: Moderately Isolated (09/07/2022)   Social Connection and Isolation Panel [NHANES]    Frequency of Communication with Friends and Family: More than three times a week    Frequency of Social Gatherings with Friends and Family: More than three times a week    Attends Religious Services: Never    Database administrator or Organizations: No    Attends Engineer, structural: Never    Marital Status: Married    CONSTITUTIONAL: see HPI E/N/T: Negative for ear pain, nasal congestion and sore throat.  CARDIOVASCULAR: Negative for chest pain, dizziness, palpitations and pedal edema.  RESPIRATORY: Negative for recent cough and dyspnea.  GASTROINTESTINAL: Negative for abdominal pain, acid reflux symptoms, constipation, diarrhea, nausea and vomiting.  MSK: Negative for arthralgias and myalgias.  INTEGUMENTARY: Negative for rash.  NEUROLOGICAL: Negative for dizziness and headaches.  PSYCHIATRIC: see HPI      Objective:  PHYSICAL EXAM:   VS: BP  122/76 (BP Location: Left Arm, Patient Position: Sitting, Cuff Size: Large)   Pulse 93   Temp (!) 97.1 F (36.2 C) (Temporal)   Resp 16   Ht 5\' 4"  (1.626 m)   Wt 235 lb 6.4 oz (106.8 kg)   SpO2 98%   BMI 40.41 kg/m   GEN: Well nourished, well developed, in no acute distress  Cardiac: RRR; no murmurs, rubs, or gallops,no edema -  Respiratory:  normal respiratory rate and pattern with no distress - normal breath sounds with no rales, rhonchi, wheezes or rubs MS: no deformity or atrophy  Skin: warm and dry, no rash  Psych: euthymic mood, appropriate affect and demeanor  Lab Results  Component Value Date   WBC 7.9 05/03/2022   HGB 13.8 05/03/2022   HCT 41.9 05/03/2022   PLT 303 05/03/2022   GLUCOSE 190 (H) 05/03/2022   CHOL 175 05/03/2022   TRIG 272 (H) 05/03/2022   HDL 42 05/03/2022   LDLCALC 88 05/03/2022   ALT 37 (H) 05/03/2022   AST 38 05/03/2022   NA 138 05/03/2022   K 5.9 (H) 05/03/2022   CL 99 05/03/2022   CREATININE 0.99 05/03/2022   BUN 22 05/03/2022   CO2 23 05/03/2022   TSH 1.090 12/18/2021   HGBA1C 7.8 (H) 05/03/2022      Assessment & Plan:   Problem List Items Addressed This Visit       Cardiovascular and Mediastinum   Benign hypertension - Primary   Relevant Orders   Comprehensive metabolic panel   Lipid panel Continue current meds as directed   Other Visit Diagnoses     Mixed hyperlipidemia       Relevant Orders   Comprehensive metabolic panel   Lipid panel Watch diet Continue meds  NIDDM Continue current meds Watch diet Labwork pending  History of renal cancer Referral to nephrologist  GERD Continue omeprazole 40mg  qd   History of neuropathy Continue neurontin as directed  Daytime somnolence Sleep study ordered     .  Meds ordered this encounter  Medications   ondansetron (ZOFRAN-ODT) 4 MG disintegrating tablet    Sig: Take 1 tablet (4 mg total) by mouth every 8 (eight) hours as needed for nausea or vomiting.     Dispense:  20 tablet    Refill:  1    Order Specific Question:   Supervising Provider    Answer:   Marianne Sofia [id number]    Orders Placed This Encounter  Procedures   Microalbumin / creatinine urine ratio   CBC with Differential/Platelet   Comprehensive metabolic panel   TSH   Lipid panel   Hemoglobin A1c   VITAMIN D 25 Hydroxy (Vit-D Deficiency, Fractures)   Ambulatory referral to Nephrology   Ambulatory referral to Sleep Studies     Follow-up: Return in about 4 months (around 01/08/2023) for chronic fasting follow-up.  An After Visit Summary was printed and given to the patient.  Jettie Pagan [facility] [phone]

## 2022-09-08 ENCOUNTER — Other Ambulatory Visit: Payer: Self-pay | Admitting: Physician Assistant

## 2022-09-08 DIAGNOSIS — R899 Unspecified abnormal finding in specimens from other organs, systems and tissues: Secondary | ICD-10-CM

## 2022-09-08 LAB — COMPREHENSIVE METABOLIC PANEL
ALT: 35 IU/L — ABNORMAL HIGH (ref 0–32)
AST: 26 IU/L (ref 0–40)
Albumin: 4.5 g/dL (ref 3.9–4.9)
Alkaline Phosphatase: 133 IU/L — ABNORMAL HIGH (ref 44–121)
BUN/Creatinine Ratio: 23 (ref 9–23)
BUN: 24 mg/dL (ref 6–24)
Bilirubin Total: 0.5 mg/dL (ref 0.0–1.2)
CO2: 20 mmol/L (ref 20–29)
Calcium: 10.9 mg/dL — ABNORMAL HIGH (ref 8.7–10.2)
Chloride: 102 mmol/L (ref 96–106)
Creatinine, Ser: 1.05 mg/dL — ABNORMAL HIGH (ref 0.57–1.00)
Globulin, Total: 2.8 g/dL (ref 1.5–4.5)
Glucose: 162 mg/dL — ABNORMAL HIGH (ref 70–99)
Potassium: 5.2 mmol/L (ref 3.5–5.2)
Sodium: 140 mmol/L (ref 134–144)
Total Protein: 7.3 g/dL (ref 6.0–8.5)
eGFR: 65 mL/min/{1.73_m2} (ref >59)

## 2022-09-08 LAB — CBC WITH DIFFERENTIAL/PLATELET
Basophils Absolute: 0.1 10*3/uL (ref 0.0–0.2)
Basos: 1 %
EOS (ABSOLUTE): 0.1 10*3/uL (ref 0.0–0.4)
Eos: 1 %
Hematocrit: 44.8 % (ref 34.0–46.6)
Hemoglobin: 14.6 g/dL (ref 11.1–15.9)
Immature Grans (Abs): 0 10*3/uL (ref 0.0–0.1)
Immature Granulocytes: 0 %
Lymphocytes Absolute: 3 10*3/uL (ref 0.7–3.1)
Lymphs: 27 %
MCH: 28 pg (ref 26.6–33.0)
MCHC: 32.6 g/dL (ref 31.5–35.7)
MCV: 86 fL (ref 79–97)
Monocytes Absolute: 0.9 10*3/uL (ref 0.1–0.9)
Monocytes: 8 %
Neutrophils Absolute: 7.1 10*3/uL — ABNORMAL HIGH (ref 1.4–7.0)
Neutrophils: 63 %
Platelets: 317 10*3/uL (ref 150–450)
RBC: 5.21 x10E6/uL (ref 3.77–5.28)
RDW: 13.5 % (ref 11.7–15.4)
WBC: 11.3 10*3/uL — ABNORMAL HIGH (ref 3.4–10.8)

## 2022-09-08 LAB — LIPID PANEL
Chol/HDL Ratio: 4 ratio (ref 0.0–4.4)
Cholesterol, Total: 178 mg/dL (ref 100–199)
HDL: 45 mg/dL (ref >39)
LDL Chol Calc (NIH): 75 mg/dL (ref 0–99)
Triglycerides: 366 mg/dL — ABNORMAL HIGH (ref 0–149)
VLDL Cholesterol Cal: 58 mg/dL — ABNORMAL HIGH (ref 5–40)

## 2022-09-08 LAB — TSH: TSH: 4.89 u[IU]/mL — ABNORMAL HIGH (ref 0.450–4.500)

## 2022-09-08 LAB — MICROALBUMIN / CREATININE URINE RATIO
Creatinine, Urine: 108.3 mg/dL
Microalb/Creat Ratio: 29 mg/g creat (ref 0–29)
Microalbumin, Urine: 31.8 ug/mL

## 2022-09-08 LAB — VITAMIN D 25 HYDROXY (VIT D DEFICIENCY, FRACTURES): Vit D, 25-Hydroxy: 30.3 ng/mL (ref 30.0–100.0)

## 2022-09-08 LAB — HEMOGLOBIN A1C
Est. average glucose Bld gHb Est-mCnc: 160 mg/dL
Hgb A1c MFr Bld: 7.2 % — ABNORMAL HIGH (ref 4.8–5.6)

## 2022-09-11 ENCOUNTER — Other Ambulatory Visit: Payer: Self-pay | Admitting: Physician Assistant

## 2022-09-11 DIAGNOSIS — G629 Polyneuropathy, unspecified: Secondary | ICD-10-CM

## 2022-09-13 ENCOUNTER — Other Ambulatory Visit: Payer: BC Managed Care – PPO

## 2022-09-13 DIAGNOSIS — R899 Unspecified abnormal finding in specimens from other organs, systems and tissues: Secondary | ICD-10-CM

## 2022-09-14 LAB — COMPREHENSIVE METABOLIC PANEL
ALT: 36 IU/L — ABNORMAL HIGH (ref 0–32)
AST: 30 IU/L (ref 0–40)
Albumin: 4.2 g/dL (ref 3.9–4.9)
Alkaline Phosphatase: 106 IU/L (ref 44–121)
BUN/Creatinine Ratio: 18 (ref 9–23)
BUN: 19 mg/dL (ref 6–24)
Bilirubin Total: 0.4 mg/dL (ref 0.0–1.2)
CO2: 19 mmol/L — ABNORMAL LOW (ref 20–29)
Calcium: 9.5 mg/dL (ref 8.7–10.2)
Chloride: 101 mmol/L (ref 96–106)
Creatinine, Ser: 1.08 mg/dL — ABNORMAL HIGH (ref 0.57–1.00)
Globulin, Total: 2.5 g/dL (ref 1.5–4.5)
Glucose: 130 mg/dL — ABNORMAL HIGH (ref 70–99)
Potassium: 5.7 mmol/L — ABNORMAL HIGH (ref 3.5–5.2)
Sodium: 140 mmol/L (ref 134–144)
Total Protein: 6.7 g/dL (ref 6.0–8.5)
eGFR: 63 mL/min/{1.73_m2} (ref >59)

## 2022-09-27 ENCOUNTER — Encounter: Payer: Self-pay | Admitting: Physician Assistant

## 2022-09-28 ENCOUNTER — Telehealth: Payer: Self-pay

## 2022-09-28 NOTE — Telephone Encounter (Signed)
Patient made aware sleep study was positive for OSA and Order for CPAP has been done.  Patient Made Aware, Verbalized Understanding.

## 2022-10-07 ENCOUNTER — Other Ambulatory Visit: Payer: Self-pay | Admitting: Physician Assistant

## 2022-10-07 DIAGNOSIS — G629 Polyneuropathy, unspecified: Secondary | ICD-10-CM

## 2022-10-27 ENCOUNTER — Other Ambulatory Visit: Payer: Self-pay | Admitting: Physician Assistant

## 2022-10-27 DIAGNOSIS — I10 Essential (primary) hypertension: Secondary | ICD-10-CM

## 2022-11-06 ENCOUNTER — Other Ambulatory Visit: Payer: Self-pay | Admitting: Physician Assistant

## 2022-11-06 DIAGNOSIS — E1165 Type 2 diabetes mellitus with hyperglycemia: Secondary | ICD-10-CM

## 2022-11-06 DIAGNOSIS — E782 Mixed hyperlipidemia: Secondary | ICD-10-CM

## 2022-11-06 DIAGNOSIS — I10 Essential (primary) hypertension: Secondary | ICD-10-CM

## 2022-11-06 DIAGNOSIS — G629 Polyneuropathy, unspecified: Secondary | ICD-10-CM

## 2022-11-09 ENCOUNTER — Telehealth: Payer: Self-pay

## 2022-11-09 NOTE — Telephone Encounter (Signed)
Patient made aware rybelsus was approved 11/09/22-11/08/2025

## 2022-11-09 NOTE — Telephone Encounter (Signed)
She will need appt for the referral

## 2022-11-09 NOTE — Telephone Encounter (Signed)
Appointment made

## 2022-11-09 NOTE — Telephone Encounter (Signed)
Patient called and stated that she was referral to a specialist years ago when she broke some toes, and wanted to know if she can get another referral she feels she has broke the same toes again, Does she need an appointment for the referral?

## 2022-11-10 ENCOUNTER — Ambulatory Visit: Payer: BC Managed Care – PPO | Admitting: Physician Assistant

## 2022-11-10 ENCOUNTER — Encounter: Payer: Self-pay | Admitting: Physician Assistant

## 2022-11-10 VITALS — BP 138/82 | HR 91 | Temp 97.0°F | Resp 15 | Ht 64.0 in | Wt 243.8 lb

## 2022-11-10 DIAGNOSIS — M79672 Pain in left foot: Secondary | ICD-10-CM | POA: Diagnosis not present

## 2022-11-10 NOTE — Progress Notes (Signed)
Acute Office Visit  Subjective:    Patient ID: Kelly Wilkins, female    DOB: 1975/08/17, 47 y.o.   MRN: [id number]  Chief Complaint  Patient presents with   Foot Injury    HPI: Patient is in today for complaints of left foot pain.  States on Saturday she slipped in the tub causing her left foot to slide and 'crunched up ' her toes.  She has pain in all her toes except great toe.  Fourth and fifth toes swollen and cannot bend.  Has bruising under foot Has had similar injury about 2 years ago - requests referral to ortho    Current Outpatient Medications:    albuterol (VENTOLIN HFA) 108 (90 Base) MCG/ACT inhaler, Inhale 2 puffs into the lungs every 6 (six) hours as needed for wheezing or shortness of breath., Disp: 8 g, Rfl: 2   amLODipine (NORVASC) 10 MG tablet, Take 1 tablet by mouth once daily, Disp: 90 tablet, Rfl: 0   atorvastatin (LIPITOR) 10 MG tablet, Take 1 tablet by mouth once daily, Disp: 90 tablet, Rfl: 0   dapagliflozin propanediol (FARXIGA) 10 MG TABS tablet, Take 1 tablet (10 mg total) by mouth daily., Disp: 90 tablet, Rfl: 1   gabapentin (NEURONTIN) 300 MG capsule, TAKE 1 CAPSULE BY MOUTH FIVE TIMES DAILY, Disp: 150 capsule, Rfl: 0   metFORMIN (GLUCOPHAGE) 850 MG tablet, TAKE 1 TABLET BY MOUTH TWICE DAILY WITH A MEAL, Disp: 180 tablet, Rfl: 0   metoprolol tartrate (LOPRESSOR) 100 MG tablet, Take 1 tablet by mouth twice daily, Disp: 180 tablet, Rfl: 0   olmesartan (BENICAR) 20 MG tablet, Take 1 tablet (20 mg total) by mouth daily., Disp: 90 tablet, Rfl: 1   omeprazole (PRILOSEC) 40 MG capsule, Take 1 capsule (40 mg total) by mouth daily., Disp: 90 capsule, Rfl: 1   ondansetron (ZOFRAN-ODT) 4 MG disintegrating tablet, Take 1 tablet (4 mg total) by mouth every 8 (eight) hours as needed for nausea or vomiting., Disp: 20 tablet, Rfl: 1   rOPINIRole (REQUIP) 2 MG tablet, Take 1 tablet (2 mg total) by mouth at bedtime., Disp: 90 tablet, Rfl: 1   Semaglutide (RYBELSUS) 14 MG  TABS, Take 1 tablet by mouth once daily, Disp: 90 tablet, Rfl: 0  Allergies  Allergen Reactions   Ibuprofen Other (See Comments) and Nausea And Vomiting    Due to history of kidney cancer Other reaction(s): Other (See Comments) Due to history of kidney cancer Other reaction(s): Other (See Comments) Other reaction(s): Other (See Comments) Due to history of kidney cancer Due to history of kidney cancer Due to history of kidney cancer Other reaction(s): Other (See Comments) Due to history of kidney cancer   Lisinopril Cough   Codeine Rash   Penicillins Nausea And Vomiting and Rash   Sulfa Antibiotics Nausea And Vomiting and Rash    ROS CONSTITUTIONAL: Negative for chills,  MSK:see HPI INTEGUMENTARY: Negative for rash.      Objective:    PHYSICAL EXAM:   BP 138/82 (BP Location: Left Arm, Patient Position: Sitting, Cuff Size: Large)   Pulse 91   Temp (!) 97 F (36.1 C) (Temporal)   Resp 15   Ht 5\' 4"  (1.626 m)   Wt 243 lb 12.8 oz (110.6 kg)   SpO2 97%   BMI 41.85 kg/m    GEN: Well nourished, well developed, in no acute distress   Cardiac: RRR; no murmurs,  Respiratory:  normal respiratory rate and pattern with no distress - normal breath  sounds with no rales, rhonchi, wheezes or rubs  MS: swelling of toes - tender to top of left foot Skin: warm and dry, no rash       Assessment & Plan:    Left foot pain -     Ambulatory referral to Orthopedic Surgery -     DG Foot Complete Left; Future Use tylenol/ibuprofen as needed Keep foot elevated Pt has a boot she is wearing now    Follow-up: Return if symptoms worsen or fail to improve.  An After Visit Summary was printed and given to the patient.  Jettie Pagan [facility] [phone]

## 2022-11-11 ENCOUNTER — Other Ambulatory Visit: Payer: Self-pay

## 2022-11-11 ENCOUNTER — Encounter: Payer: Self-pay | Admitting: Physician Assistant

## 2022-11-11 DIAGNOSIS — M79672 Pain in left foot: Secondary | ICD-10-CM

## 2022-11-16 ENCOUNTER — Other Ambulatory Visit: Payer: Self-pay | Admitting: Physician Assistant

## 2022-11-16 DIAGNOSIS — E1165 Type 2 diabetes mellitus with hyperglycemia: Secondary | ICD-10-CM

## 2022-11-17 ENCOUNTER — Telehealth: Payer: Self-pay

## 2022-11-17 NOTE — Telephone Encounter (Signed)
Patient called and stated she went to see foot and ankle doctor in [place], she couldn't wait on [facility] and they stated that her toe that was swollen really bad was broken in multi-places, if you have any questions for her you can call her.

## 2022-11-25 ENCOUNTER — Ambulatory Visit: Payer: BC Managed Care – PPO | Admitting: Physician Assistant

## 2022-11-26 ENCOUNTER — Telehealth: Payer: BC Managed Care – PPO

## 2022-11-26 ENCOUNTER — Ambulatory Visit (INDEPENDENT_AMBULATORY_CARE_PROVIDER_SITE_OTHER): Payer: BC Managed Care – PPO

## 2022-11-26 DIAGNOSIS — R509 Fever, unspecified: Secondary | ICD-10-CM | POA: Insufficient documentation

## 2022-11-26 DIAGNOSIS — J069 Acute upper respiratory infection, unspecified: Secondary | ICD-10-CM | POA: Diagnosis not present

## 2022-11-26 DIAGNOSIS — R1114 Bilious vomiting: Secondary | ICD-10-CM

## 2022-11-26 DIAGNOSIS — U071 COVID-19: Secondary | ICD-10-CM | POA: Diagnosis not present

## 2022-11-26 DIAGNOSIS — R5081 Fever presenting with conditions classified elsewhere: Secondary | ICD-10-CM

## 2022-11-26 HISTORY — DX: Fever, unspecified: R50.9

## 2022-11-26 LAB — POC COVID19 BINAXNOW: SARS Coronavirus 2 Ag: POSITIVE — AB

## 2022-11-26 MED ORDER — ONDANSETRON HCL 4 MG PO TABS: 4.0000 mg | ORAL_TABLET | Freq: Three times a day (TID) | ORAL | 0 refills | Status: AC | PRN

## 2022-11-26 MED ORDER — NIRMATRELVIR/RITONAVIR (PAXLOVID)TABLET: 3.0000 | ORAL_TABLET | Freq: Two times a day (BID) | ORAL | 0 refills | Status: AC

## 2022-11-26 NOTE — Progress Notes (Signed)
Virtual Visit via Video Note   This visit type was conducted either per patient request OR due to national recommendations for restrictions regarding the COVID-19 Pandemic (e.g. social distancing) in an effort to limit this patient's exposure and mitigate transmission in our community.  Due to her co-morbid illnesses, this patient is at least at moderate risk for complications without adequate follow up.  This format is felt to be most appropriate for this patient at this time.  All issues noted in this document were discussed and addressed.  A limited physical exam was performed with this format.  A verbal consent was obtained for the virtual visit.   Date:  11/26/2022   ID:  Kelly Wilkins, DOB 1975-08-28, MRN [id number]  Patient Location: Home Provider Location: Office/Clinic  PCP:  Marianne Sofia, PA-C   Chief Complaint:  Patient complains of ST, Cough, Fever, Nausea, Vomiting and ear pain. Patient was tested for COVID and was positive.    History of Present Illness:    Kelly Wilkins is a 47 y.o. female with symptoms as above yesterday. No known covid positive exposure. Reports Tmax of 101 yesterday, was sent home from work. Symptom of most concern- vomiting. Bilious. Pain in her abdomen. Could only keep down saltine crackers.  Chief Complaint  Patient presents with   Positive COVID  .  The patient does have symptoms concerning for COVID-19 infection (fever, chills, cough, or new shortness of breath).    Past Medical History:  Diagnosis Date   Diabetes mellitus without complication (HCC)    Renal cancer [facility])     Past Surgical History:  Procedure Laterality Date   ABDOMINAL HYSTERECTOMY     BREAST BIOPSY Left 08/27/2022   MM LT BREAST BX W LOC DEV 1ST LESION IMAGE BX SPEC STEREO GUIDE 08/27/2022 [facility]   BREAST CYST ASPIRATION Bilateral    CHOLECYSTECTOMY     NEPHRECTOMY     TONSILLECTOMY      Family History  Problem Relation Age of Onset   Breast  cancer Maternal Aunt    Hypertension Maternal Grandmother    Diabetes Maternal Grandmother    Hypertension Maternal Grandfather    Diabetes Maternal Grandfather    Hypertension Paternal Grandmother    Diabetes Paternal Grandmother    Hypertension Paternal Grandfather     Social History   Socioeconomic History   Marital status: Married    Spouse name: Not on file   Number of children: Not on file   Years of education: Not on file   Highest education level: Not on file  Occupational History   Not on file  Tobacco Use   Smoking status: Former    Types: Cigarettes   Smokeless tobacco: Not on file   Tobacco comments:    Stopped 12 years ago  Substance and Sexual Activity   Alcohol use: Yes    Comment: occasional   Drug use: No   Sexual activity: Not on file  Other Topics Concern   Not on file  Social History Narrative   Not on file   Social Determinants of Health   Financial Resource Strain: Low Risk  (09/07/2022)   Overall Financial Resource Strain (CARDIA)    Difficulty of Paying Living Expenses: Not hard at all  Food Insecurity: No Food Insecurity (09/07/2022)   Hunger Vital Sign    Worried About Running Out of Food in the Last Year: Never true    Ran Out of Food in the Last Year: Never true  Transportation  Needs: No Transportation Needs (09/07/2022)   PRAPARE - Administrator, Civil Service (Medical): No    Lack of Transportation (Non-Medical): No  Physical Activity: Sufficiently Active (09/07/2022)   Exercise Vital Sign    Days of Exercise per Week: 6 days    Minutes of Exercise per Session: 30 min  Stress: No Stress Concern Present (09/07/2022)   [employer] of Occupational Health - Occupational Stress Questionnaire    Feeling of Stress : Not at all  Social Connections: Moderately Isolated (09/07/2022)   Social Connection and Isolation Panel [NHANES]    Frequency of Communication with Friends and Family: More than three times a week    Frequency of  Social Gatherings with Friends and Family: More than three times a week    Attends Religious Services: Never    Database administrator or Organizations: No    Attends Banker Meetings: Never    Marital Status: Married  Catering manager Violence: Not At Risk (09/07/2022)   Humiliation, Afraid, Rape, and Kick questionnaire    Fear of Current or Ex-Partner: No    Emotionally Abused: No    Physically Abused: No    Sexually Abused: No    Outpatient Medications Prior to Visit  Medication Sig Dispense Refill   albuterol (VENTOLIN HFA) 108 (90 Base) MCG/ACT inhaler Inhale 2 puffs into the lungs every 6 (six) hours as needed for wheezing or shortness of breath. 8 g 2   amLODipine (NORVASC) 10 MG tablet Take 1 tablet by mouth once daily 90 tablet 0   atorvastatin (LIPITOR) 10 MG tablet Take 1 tablet by mouth once daily 90 tablet 0   FARXIGA 10 MG TABS tablet Take 1 tablet by mouth once daily 90 tablet 0   gabapentin (NEURONTIN) 300 MG capsule TAKE 1 CAPSULE BY MOUTH FIVE TIMES DAILY 150 capsule 0   metFORMIN (GLUCOPHAGE) 850 MG tablet TAKE 1 TABLET BY MOUTH TWICE DAILY WITH A MEAL 180 tablet 0   metoprolol tartrate (LOPRESSOR) 100 MG tablet Take 1 tablet by mouth twice daily 180 tablet 0   olmesartan (BENICAR) 20 MG tablet Take 1 tablet (20 mg total) by mouth daily. 90 tablet 1   omeprazole (PRILOSEC) 40 MG capsule Take 1 capsule (40 mg total) by mouth daily. 90 capsule 1   ondansetron (ZOFRAN-ODT) 4 MG disintegrating tablet Take 1 tablet (4 mg total) by mouth every 8 (eight) hours as needed for nausea or vomiting. 20 tablet 1   rOPINIRole (REQUIP) 2 MG tablet Take 1 tablet (2 mg total) by mouth at bedtime. 90 tablet 1   Semaglutide (RYBELSUS) 14 MG TABS Take 1 tablet by mouth once daily 90 tablet 0   No facility-administered medications prior to visit.    Allergies  Allergen Reactions   Ibuprofen Other (See Comments) and Nausea And Vomiting    Due to history of kidney  cancer Other reaction(s): Other (See Comments) Due to history of kidney cancer Other reaction(s): Other (See Comments) Other reaction(s): Other (See Comments) Due to history of kidney cancer Due to history of kidney cancer Due to history of kidney cancer Other reaction(s): Other (See Comments) Due to history of kidney cancer   Lisinopril Cough   Codeine Rash   Penicillins Nausea And Vomiting and Rash   Sulfa Antibiotics Nausea And Vomiting and Rash     Social History   Tobacco Use   Smoking status: Former    Types: Cigarettes   Tobacco comments:  Stopped 12 years ago  Substance Use Topics   Alcohol use: Yes    Comment: occasional   Drug use: No     Review of Systems  Constitutional:  Positive for fever and malaise/fatigue.  HENT:  Positive for congestion, ear pain (Right ear) and sore throat.   Respiratory:  Positive for cough. Negative for shortness of breath.   Cardiovascular:  Negative for chest pain.  Gastrointestinal:  Positive for nausea and vomiting. Negative for abdominal pain, constipation and diarrhea.  Genitourinary:  Negative for frequency.  Musculoskeletal:  Negative for joint pain and myalgias.  Neurological:  Negative for dizziness, weakness and headaches.  Psychiatric/Behavioral:  Negative for depression.      Labs/Other Tests and Data Reviewed:    Recent Labs: 09/07/2022: Hemoglobin 14.6; Platelets 317; TSH 4.890 09/13/2022: ALT 36; BUN 19; Creatinine, Ser 1.08; Potassium 5.7; Sodium 140   Recent Lipid Panel Lab Results  Component Value Date/Time   CHOL 178 09/07/2022 08:50 AM   TRIG 366 (H) 09/07/2022 08:50 AM   HDL 45 09/07/2022 08:50 AM   CHOLHDL 4.0 09/07/2022 08:50 AM   LDLCALC 75 09/07/2022 08:50 AM    Wt Readings from Last 3 Encounters:  11/10/22 243 lb 12.8 oz (110.6 kg)  09/07/22 235 lb 6.4 oz (106.8 kg)  05/03/22 238 lb (108 kg)     Objective:    Vital Signs:  There were no vitals taken for this visit.   Physical Exam Vitals  (Unable to perform physical exam as this is a telehealth visit) reviewed.      ASSESSMENT & PLAN:   Upper respiratory tract infection due to COVID-19 virus  Fever in other diseases Assessment & Plan: Kelly Wilkins is contacted via telehealth to discuss her symptoms of upper respiratory infection which started yesterday. Tested positive for COVID today. Most bothersome symptoms are congestion and nausea and vomiting.  Plan: Paxlovid sent to her pharmacy as she is within the 5-day window and is willing to try -Zofran sent for nausea and vomiting Discussed the side effects of Paxlovid including metallic taste in mouth and nausea. -Advised to hold atorvastatin and only take half of amlodipine while on Paxlovid -Other supportive treatment recommended. -Report back if any worsening symptoms or call 911 for any severe symptoms    Bilious vomiting with nausea  Other orders -     nirmatrelvir/ritonavir; Take 3 tablets by mouth 2 (two) times daily for 5 days. (Take nirmatrelvir 150 mg two tablets twice daily for 5 days and ritonavir 100 mg one tablet twice daily for 5 days) Patient GFR is 65  Dispense: 30 tablet; Refill: 0 -     Ondansetron HCl; Take 1 tablet (4 mg total) by mouth every 8 (eight) hours as needed for up to 5 days for nausea or vomiting.  Dispense: 15 tablet; Refill: 0     No orders of the defined types were placed in this encounter.    Meds ordered this encounter  Medications   nirmatrelvir/ritonavir (PAXLOVID) 20 x 150 MG & 10 x 100MG  TABS    Sig: Take 3 tablets by mouth 2 (two) times daily for 5 days. (Take nirmatrelvir 150 mg two tablets twice daily for 5 days and ritonavir 100 mg one tablet twice daily for 5 days) Patient GFR is 65    Dispense:  30 tablet    Refill:  0    Patient on day 2 of symptoms. Will hold Atorvastatin and take half of Amlodipine while on paxlovid   ondansetron (  ZOFRAN) 4 MG tablet    Sig: Take 1 tablet (4 mg total) by mouth every 8 (eight) hours as  needed for up to 5 days for nausea or vomiting.    Dispense:  15 tablet    Refill:  0    I spent < 20 > minutes dedicated to the care of this patient on the date of this encounter to include face-to-face time with the patient.  Follow Up:   as  prn  Signed, Windell Moment, MD  11/26/2022 8:52 AM    [facility]

## 2022-11-26 NOTE — Assessment & Plan Note (Signed)
Kelly Wilkins is contacted via telehealth to discuss her symptoms of upper respiratory infection which started yesterday. Tested positive for COVID today. Most bothersome symptoms are congestion and nausea and vomiting.  Plan: Paxlovid sent to her pharmacy as she is within the 5-day window and is willing to try -Zofran sent for nausea and vomiting Discussed the side effects of Paxlovid including metallic taste in mouth and nausea. -Advised to hold atorvastatin and only take half of amlodipine while on Paxlovid -Other supportive treatment recommended. -Report back if any worsening symptoms or call 911 for any severe symptoms

## 2022-11-30 ENCOUNTER — Other Ambulatory Visit: Payer: Self-pay | Admitting: Physician Assistant

## 2022-11-30 DIAGNOSIS — J9801 Acute bronchospasm: Secondary | ICD-10-CM

## 2022-12-01 ENCOUNTER — Telehealth: Payer: Self-pay

## 2022-12-01 NOTE — Telephone Encounter (Signed)
Patient called stated she tested positive for covid on 11/26/22 and she is still having cough, and chest congestion, and low grade fever highest is 99.8. Patient has ben taking dayquil to help with cough.   Schedule for 12/02/22, is it okay for her to come in office? Please advise

## 2022-12-02 ENCOUNTER — Ambulatory Visit (INDEPENDENT_AMBULATORY_CARE_PROVIDER_SITE_OTHER): Payer: BC Managed Care – PPO | Admitting: Family Medicine

## 2022-12-02 ENCOUNTER — Encounter: Payer: Self-pay | Admitting: Family Medicine

## 2022-12-02 VITALS — BP 124/76 | HR 96 | Temp 97.2°F | Ht 64.0 in | Wt 235.0 lb

## 2022-12-02 DIAGNOSIS — U071 COVID-19: Secondary | ICD-10-CM | POA: Insufficient documentation

## 2022-12-02 DIAGNOSIS — J069 Acute upper respiratory infection, unspecified: Secondary | ICD-10-CM

## 2022-12-02 DIAGNOSIS — J029 Acute pharyngitis, unspecified: Secondary | ICD-10-CM

## 2022-12-02 HISTORY — DX: Acute upper respiratory infection, unspecified: J06.9

## 2022-12-02 HISTORY — DX: Acute pharyngitis, unspecified: J02.9

## 2022-12-02 LAB — POCT RAPID STREP A (OFFICE): Rapid Strep A Screen: NEGATIVE

## 2022-12-02 MED ORDER — PREDNISONE 20 MG PO TABS
ORAL_TABLET | ORAL | 0 refills | Status: AC
Start: 2022-12-02 — End: 2022-12-10

## 2022-12-02 MED ORDER — BENZONATATE 200 MG PO CAPS
200.0000 mg | ORAL_CAPSULE | Freq: Three times a day (TID) | ORAL | 0 refills | Status: DC | PRN
Start: 2022-12-02 — End: 2023-04-14

## 2022-12-02 MED ORDER — DEXTROMETHORPHAN HBR 15 MG/5ML PO SYRP
10.0000 mL | ORAL_SOLUTION | Freq: Four times a day (QID) | ORAL | 0 refills | Status: DC | PRN
Start: 2022-12-02 — End: 2023-04-14

## 2022-12-02 NOTE — Assessment & Plan Note (Addendum)
Acute Tested positive for Covid on 11/26/22 Patient was given Paxlovid, Zofran and albuterol STAT chest xray ordered today to R/O pneumonia Prednisone 20 mg -Take 3 pills daily in am with food x 3 days, then 2 pills daily in am with food x 3 days, then 1 pill daily in am with food x 3 days, then stop.  Tessalon 200 mg by mouth THREE TIMES A DAY as needed for cough Dextromethorphan 10 ml by mouth four times a day as needed for cough Await testing for assessment and recommendations.

## 2022-12-02 NOTE — Progress Notes (Signed)
Acute Office Visit  Subjective:    Patient ID: Kelly Wilkins, female    DOB: 01/17/1976, 47 y.o.   MRN: [id number]  Chief Complaint  Patient presents with   URI    HPI: Patient is in today for cough, states she was positive for covid last week. Still has a lot of congestion. Cough is dry, worse at night, dayquil and nyquil along with cough drops do not help. Left side of neck hurts from coughing.   Past Medical History:  Diagnosis Date   Diabetes mellitus without complication (HCC)    Renal cancer [facility])     Past Surgical History:  Procedure Laterality Date   ABDOMINAL HYSTERECTOMY     BREAST BIOPSY Left 08/27/2022   MM LT BREAST BX W LOC DEV 1ST LESION IMAGE BX SPEC STEREO GUIDE 08/27/2022 [facility]   BREAST CYST ASPIRATION Bilateral    CHOLECYSTECTOMY     NEPHRECTOMY     TONSILLECTOMY      Family History  Problem Relation Age of Onset   Breast cancer Maternal Aunt    Hypertension Maternal Grandmother    Diabetes Maternal Grandmother    Hypertension Maternal Grandfather    Diabetes Maternal Grandfather    Hypertension Paternal Grandmother    Diabetes Paternal Grandmother    Hypertension Paternal Grandfather     Social History   Socioeconomic History   Marital status: Married    Spouse name: Not on file   Number of children: Not on file   Years of education: Not on file   Highest education level: Not on file  Occupational History   Not on file  Tobacco Use   Smoking status: Former    Types: Cigarettes   Smokeless tobacco: Not on file   Tobacco comments:    Stopped 12 years ago  Substance and Sexual Activity   Alcohol use: Yes    Comment: occasional   Drug use: No   Sexual activity: Not on file  Other Topics Concern   Not on file  Social History Narrative   Not on file   Social Determinants of Health   Financial Resource Strain: Low Risk  (09/07/2022)   Overall Financial Resource Strain (CARDIA)    Difficulty of Paying Living Expenses:  Not hard at all  Food Insecurity: No Food Insecurity (09/07/2022)   Hunger Vital Sign    Worried About Running Out of Food in the Last Year: Never true    Ran Out of Food in the Last Year: Never true  Transportation Needs: No Transportation Needs (09/07/2022)   PRAPARE - Administrator, Civil Service (Medical): No    Lack of Transportation (Non-Medical): No  Physical Activity: Sufficiently Active (09/07/2022)   Exercise Vital Sign    Days of Exercise per Week: 6 days    Minutes of Exercise per Session: 30 min  Stress: No Stress Concern Present (09/07/2022)   [employer] of Occupational Health - Occupational Stress Questionnaire    Feeling of Stress : Not at all  Social Connections: Moderately Isolated (09/07/2022)   Social Connection and Isolation Panel [NHANES]    Frequency of Communication with Friends and Family: More than three times a week    Frequency of Social Gatherings with Friends and Family: More than three times a week    Attends Religious Services: Never    Database administrator or Organizations: No    Attends Banker Meetings: Never    Marital Status: Married  Intimate  Partner Violence: Not At Risk (09/07/2022)   Humiliation, Afraid, Rape, and Kick questionnaire    Fear of Current or Ex-Partner: No    Emotionally Abused: No    Physically Abused: No    Sexually Abused: No    Outpatient Medications Prior to Visit  Medication Sig Dispense Refill   albuterol (VENTOLIN HFA) 108 (90 Base) MCG/ACT inhaler INHALE 2 PUFFS BY MOUTH EVERY 6 HOURS AS NEEDED FOR WHEEZING FOR SHORTNESS OF BREATH 9 g 0   amLODipine (NORVASC) 10 MG tablet Take 1 tablet by mouth once daily 90 tablet 0   atorvastatin (LIPITOR) 10 MG tablet Take 1 tablet by mouth once daily 90 tablet 0   FARXIGA 10 MG TABS tablet Take 1 tablet by mouth once daily 90 tablet 0   gabapentin (NEURONTIN) 300 MG capsule TAKE 1 CAPSULE BY MOUTH FIVE TIMES DAILY 150 capsule 0   metFORMIN (GLUCOPHAGE) 850  MG tablet TAKE 1 TABLET BY MOUTH TWICE DAILY WITH A MEAL 180 tablet 0   metoprolol tartrate (LOPRESSOR) 100 MG tablet Take 1 tablet by mouth twice daily 180 tablet 0   olmesartan (BENICAR) 20 MG tablet Take 1 tablet (20 mg total) by mouth daily. 90 tablet 1   omeprazole (PRILOSEC) 40 MG capsule Take 1 capsule (40 mg total) by mouth daily. 90 capsule 1   ondansetron (ZOFRAN-ODT) 4 MG disintegrating tablet Take 1 tablet (4 mg total) by mouth every 8 (eight) hours as needed for nausea or vomiting. 20 tablet 1   rOPINIRole (REQUIP) 2 MG tablet Take 1 tablet (2 mg total) by mouth at bedtime. 90 tablet 1   Semaglutide (RYBELSUS) 14 MG TABS Take 1 tablet by mouth once daily 90 tablet 0   No facility-administered medications prior to visit.    Allergies  Allergen Reactions   Ibuprofen Other (See Comments) and Nausea And Vomiting    Due to history of kidney cancer Other reaction(s): Other (See Comments) Due to history of kidney cancer Other reaction(s): Other (See Comments) Other reaction(s): Other (See Comments) Due to history of kidney cancer Due to history of kidney cancer Due to history of kidney cancer Other reaction(s): Other (See Comments) Due to history of kidney cancer   Lisinopril Cough   Codeine Rash   Penicillins Nausea And Vomiting and Rash   Sulfa Antibiotics Nausea And Vomiting and Rash    Review of Systems  Constitutional:  Positive for fever (low grade). Negative for chills and fatigue.  HENT:  Positive for sore throat. Negative for congestion, ear pain and rhinorrhea.   Respiratory:  Positive for cough and chest tightness. Negative for shortness of breath.   Cardiovascular:  Negative for chest pain.  Gastrointestinal:  Negative for abdominal pain, constipation, diarrhea, nausea and vomiting.  Genitourinary:  Negative for dysuria and urgency.  Musculoskeletal:  Negative for back pain and myalgias.  Neurological:  Negative for dizziness, weakness, light-headedness and  headaches.  Psychiatric/Behavioral:  Negative for dysphoric mood. The patient is not nervous/anxious.        Objective:        12/02/2022    7:58 AM 11/10/2022    3:46 PM 09/07/2022    8:23 AM  Vitals with BMI  Height 5\' 4"  5\' 4"  5\' 4"   Weight 235 lbs 243 lbs 13 oz 235 lbs 6 oz  BMI 40.32 41.83 40.39  Systolic 124 138 956  Diastolic 76 82 76  Pulse 96 91 93    No data found.   Physical Exam Vitals  reviewed.  Constitutional:      General: She is not in acute distress.    Appearance: She is obese. She is ill-appearing.  HENT:     Right Ear: Tympanic membrane normal. No tenderness.     Left Ear: Tympanic membrane normal. No tenderness. There is impacted cerumen (partial).     Nose: Rhinorrhea present.     Right Turbinates: Swollen.     Left Turbinates: Swollen.     Right Sinus: No maxillary sinus tenderness or frontal sinus tenderness.     Left Sinus: No maxillary sinus tenderness or frontal sinus tenderness.     Mouth/Throat:     Pharynx: Oropharynx is clear.  Cardiovascular:     Rate and Rhythm: Normal rate and regular rhythm.  Pulmonary:     Effort: Pulmonary effort is normal.     Breath sounds: Decreased air movement present. Examination of the right-lower field reveals rhonchi. Examination of the left-lower field reveals rhonchi. Rhonchi present.  Chest:     Chest wall: Tenderness present.  Lymphadenopathy:     Head:     Left side of head: Submandibular adenopathy present.  Skin:    General: Skin is warm.  Neurological:     Mental Status: She is alert and oriented to person, place, and time.  Psychiatric:        Mood and Affect: Mood normal.     Health Maintenance Due  Topic Date Due   OPHTHALMOLOGY EXAM  Never done   INFLUENZA VACCINE  10/07/2022    There are no preventive care reminders to display for this patient.   Lab Results  Component Value Date   TSH 4.890 (H) 09/07/2022   Lab Results  Component Value Date   WBC 11.3 (H) 09/07/2022   HGB  14.6 09/07/2022   HCT 44.8 09/07/2022   MCV 86 09/07/2022   PLT 317 09/07/2022   Lab Results  Component Value Date   NA 140 09/13/2022   K 5.7 (H) 09/13/2022   CO2 19 (L) 09/13/2022   GLUCOSE 130 (H) 09/13/2022   BUN 19 09/13/2022   CREATININE 1.08 (H) 09/13/2022   BILITOT 0.4 09/13/2022   ALKPHOS 106 09/13/2022   AST 30 09/13/2022   ALT 36 (H) 09/13/2022   PROT 6.7 09/13/2022   ALBUMIN 4.2 09/13/2022   CALCIUM 9.5 09/13/2022   EGFR 63 09/13/2022   Lab Results  Component Value Date   CHOL 178 09/07/2022   Lab Results  Component Value Date   HDL 45 09/07/2022   Lab Results  Component Value Date   LDLCALC 75 09/07/2022   Lab Results  Component Value Date   TRIG 366 (H) 09/07/2022   Lab Results  Component Value Date   CHOLHDL 4.0 09/07/2022   Lab Results  Component Value Date   HGBA1C 7.2 (H) 09/07/2022       Assessment & Plan:  Upper respiratory tract infection due to COVID-19 virus Assessment & Plan: Acute Tested positive for Covid on 11/26/22 Patient was given Paxlovid, Zofran and albuterol  STAT chest xray ordered to R/O pneumonia Prednisone 20 mg -Take 3 pills daily in am with food x 3 days, then 2 pills daily in am with food x 3 days, then 1 pill daily in am with food x 3 days, then stop.  Tessalon 200 mg by mouth THREE TIMES A DAY as needed for cough Dextromethorphan 10 ml by mouth four times a day as needed for cough Await testing for assessment and recommendations.  Orders: -     DG Chest 2 View -     Dextromethorphan HBr; Take 10 mLs (30 mg total) by mouth 4 (four) times daily as needed for cough.  Dispense: 120 mL; Refill: 0 -     predniSONE; Take 3 tablets (60 mg total) by mouth daily with breakfast for 3 days, THEN 2 tablets (40 mg total) daily with breakfast for 3 days, THEN 1 tablet (20 mg total) daily with breakfast for 3 days.  Dispense: 18 tablet; Refill: 0 -     Benzonatate; Take 1 capsule (200 mg total) by mouth 3 (three) times daily as  needed for cough.  Dispense: 30 capsule; Refill: 0  Acute sore throat Assessment & Plan: Acute  Strep - negative Gargle with warm salt water Take tylenol for pain as needed  Orders: -     POCT rapid strep A     Meds ordered this encounter  Medications   dextromethorphan 15 MG/5ML syrup    Sig: Take 10 mLs (30 mg total) by mouth 4 (four) times daily as needed for cough.    Dispense:  120 mL    Refill:  0   predniSONE (DELTASONE) 20 MG tablet    Sig: Take 3 tablets (60 mg total) by mouth daily with breakfast for 3 days, THEN 2 tablets (40 mg total) daily with breakfast for 3 days, THEN 1 tablet (20 mg total) daily with breakfast for 3 days.    Dispense:  18 tablet    Refill:  0   benzonatate (TESSALON) 200 MG capsule    Sig: Take 1 capsule (200 mg total) by mouth 3 (three) times daily as needed for cough.    Dispense:  30 capsule    Refill:  0    Orders Placed This Encounter  Procedures   DG Chest 2 View   Rapid Strep A     Follow-up: Return if symptoms worsen or fail to improve.  An After Visit Summary was printed and given to the patient.  Total time spent on today's visit was greater than 30 minutes, including both face-to-face time and nonface-to-face time personally spent on review of chart (labs and imaging), discussing labs and goals, discussing further work-up, treatment options, referrals to specialist if needed, reviewing outside records if pertinent, answering patient's questions, and coordinating care.   Lajuana Matte, FNP [facility] [phone]

## 2022-12-02 NOTE — Assessment & Plan Note (Signed)
Acute  Strep - negative Gargle with warm salt water Take tylenol for pain as needed

## 2022-12-06 ENCOUNTER — Other Ambulatory Visit: Payer: Self-pay | Admitting: Physician Assistant

## 2022-12-06 DIAGNOSIS — G629 Polyneuropathy, unspecified: Secondary | ICD-10-CM

## 2022-12-07 ENCOUNTER — Other Ambulatory Visit: Payer: Self-pay | Admitting: Physician Assistant

## 2022-12-07 DIAGNOSIS — E1165 Type 2 diabetes mellitus with hyperglycemia: Secondary | ICD-10-CM

## 2022-12-17 ENCOUNTER — Other Ambulatory Visit: Payer: Self-pay | Admitting: Physician Assistant

## 2022-12-17 DIAGNOSIS — G2581 Restless legs syndrome: Secondary | ICD-10-CM

## 2022-12-29 ENCOUNTER — Other Ambulatory Visit: Payer: Self-pay | Admitting: Physician Assistant

## 2022-12-29 DIAGNOSIS — G2581 Restless legs syndrome: Secondary | ICD-10-CM

## 2022-12-31 ENCOUNTER — Other Ambulatory Visit: Payer: Self-pay | Admitting: Physician Assistant

## 2022-12-31 DIAGNOSIS — G2581 Restless legs syndrome: Secondary | ICD-10-CM

## 2023-01-01 ENCOUNTER — Other Ambulatory Visit: Payer: Self-pay | Admitting: Physician Assistant

## 2023-01-01 DIAGNOSIS — K219 Gastro-esophageal reflux disease without esophagitis: Secondary | ICD-10-CM

## 2023-01-01 DIAGNOSIS — I10 Essential (primary) hypertension: Secondary | ICD-10-CM

## 2023-01-05 ENCOUNTER — Other Ambulatory Visit: Payer: Self-pay | Admitting: Physician Assistant

## 2023-01-21 ENCOUNTER — Ambulatory Visit: Payer: BC Managed Care – PPO | Admitting: Physician Assistant

## 2023-01-29 ENCOUNTER — Other Ambulatory Visit: Payer: Self-pay | Admitting: Physician Assistant

## 2023-01-29 DIAGNOSIS — I10 Essential (primary) hypertension: Secondary | ICD-10-CM

## 2023-02-02 ENCOUNTER — Other Ambulatory Visit: Payer: Self-pay | Admitting: Family Medicine

## 2023-02-02 DIAGNOSIS — G629 Polyneuropathy, unspecified: Secondary | ICD-10-CM

## 2023-02-02 NOTE — Telephone Encounter (Signed)
Appointment has been made

## 2023-02-02 NOTE — Telephone Encounter (Signed)
Pt no showed last appt --- needs to schedule chronic fasting follow up and then will refill med

## 2023-02-03 ENCOUNTER — Other Ambulatory Visit: Payer: Self-pay | Admitting: Family Medicine

## 2023-02-03 DIAGNOSIS — G629 Polyneuropathy, unspecified: Secondary | ICD-10-CM

## 2023-02-05 ENCOUNTER — Other Ambulatory Visit: Payer: Self-pay | Admitting: Physician Assistant

## 2023-02-05 DIAGNOSIS — I10 Essential (primary) hypertension: Secondary | ICD-10-CM

## 2023-02-17 ENCOUNTER — Other Ambulatory Visit: Payer: Self-pay | Admitting: Physician Assistant

## 2023-02-17 DIAGNOSIS — E1165 Type 2 diabetes mellitus with hyperglycemia: Secondary | ICD-10-CM

## 2023-02-19 ENCOUNTER — Other Ambulatory Visit: Payer: Self-pay | Admitting: Physician Assistant

## 2023-02-19 DIAGNOSIS — E1165 Type 2 diabetes mellitus with hyperglycemia: Secondary | ICD-10-CM

## 2023-02-19 DIAGNOSIS — E782 Mixed hyperlipidemia: Secondary | ICD-10-CM

## 2023-03-07 ENCOUNTER — Encounter: Payer: Self-pay | Admitting: Oncology

## 2023-03-21 ENCOUNTER — Encounter: Payer: Self-pay | Admitting: Oncology

## 2023-03-21 ENCOUNTER — Ambulatory Visit: Payer: 59 | Admitting: Physician Assistant

## 2023-03-31 ENCOUNTER — Other Ambulatory Visit: Payer: Self-pay | Admitting: Physician Assistant

## 2023-03-31 DIAGNOSIS — I10 Essential (primary) hypertension: Secondary | ICD-10-CM

## 2023-03-31 DIAGNOSIS — E1165 Type 2 diabetes mellitus with hyperglycemia: Secondary | ICD-10-CM

## 2023-04-03 ENCOUNTER — Other Ambulatory Visit: Payer: Self-pay | Admitting: Physician Assistant

## 2023-04-03 DIAGNOSIS — G629 Polyneuropathy, unspecified: Secondary | ICD-10-CM

## 2023-04-04 ENCOUNTER — Other Ambulatory Visit: Payer: Self-pay | Admitting: Physician Assistant

## 2023-04-04 DIAGNOSIS — K219 Gastro-esophageal reflux disease without esophagitis: Secondary | ICD-10-CM

## 2023-04-14 ENCOUNTER — Encounter: Payer: Self-pay | Admitting: Physician Assistant

## 2023-04-14 ENCOUNTER — Ambulatory Visit: Payer: 59 | Admitting: Physician Assistant

## 2023-04-14 VITALS — BP 128/86 | HR 76 | Temp 97.9°F | Resp 18 | Ht 64.0 in | Wt 235.0 lb

## 2023-04-14 DIAGNOSIS — I10 Essential (primary) hypertension: Secondary | ICD-10-CM | POA: Diagnosis not present

## 2023-04-14 DIAGNOSIS — Z85528 Personal history of other malignant neoplasm of kidney: Secondary | ICD-10-CM

## 2023-04-14 DIAGNOSIS — E782 Mixed hyperlipidemia: Secondary | ICD-10-CM | POA: Diagnosis not present

## 2023-04-14 DIAGNOSIS — G4733 Obstructive sleep apnea (adult) (pediatric): Secondary | ICD-10-CM

## 2023-04-14 DIAGNOSIS — R899 Unspecified abnormal finding in specimens from other organs, systems and tissues: Secondary | ICD-10-CM

## 2023-04-14 DIAGNOSIS — E1165 Type 2 diabetes mellitus with hyperglycemia: Secondary | ICD-10-CM | POA: Diagnosis not present

## 2023-04-14 DIAGNOSIS — E559 Vitamin D deficiency, unspecified: Secondary | ICD-10-CM

## 2023-04-14 DIAGNOSIS — K219 Gastro-esophageal reflux disease without esophagitis: Secondary | ICD-10-CM

## 2023-04-14 DIAGNOSIS — R928 Other abnormal and inconclusive findings on diagnostic imaging of breast: Secondary | ICD-10-CM

## 2023-04-14 DIAGNOSIS — G2581 Restless legs syndrome: Secondary | ICD-10-CM

## 2023-04-14 MED ORDER — OLMESARTAN MEDOXOMIL 20 MG PO TABS: 20.0000 mg | ORAL_TABLET | Freq: Every day | ORAL | 5 refills | Status: DC

## 2023-04-14 NOTE — Progress Notes (Signed)
 Subjective:  Patient ID: Kelly Wilkins, female    DOB: 03-31-1975  Age: 48 y.o. MRN: [id number]  Chief Complaint  Patient presents with   Medical Management of Chronic Issues    Diabetes  Hypertension    Pt presents for follow up of hypertension.  The patient is tolerating the medication well without side effects. Compliance with treatment has been good; including taking medication as directed , has not been watching diet , and following up as directed. She is currently taking norvasc  10mg , lopressor  100mg  and benicar  20mg  qd - bp has been stable and today 128/86- denies chest pain or dyspnea  Pt with GERD - stable on prilosec 40mg  qd  Pt with NIDDM - her last hgb A1c was several months ago and was 7.8 .  She is currently taking farxiga  10mg , glucophage  850mg , and rybelsus  14mg  every day - she does have occasional nausea from rybelsus  and uses zofran   Pt with hyperlipidemia - currently on lipitor 10mg  every day  Pt with history of renal cancer in 2012.  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  She has not seen nephrologist in several years and is agreeable to get appt scheduled for follow up -- had referred to a provider she requested in [place] but pt has not heard about appt - will give appt their number to contact  Pt did have sleep study done and diagnosed with sleep apnea - is using CPAP machine now  Pt is due for left diagnostic mammogram Current Outpatient Medications on File Prior to Visit  Medication Sig Dispense Refill   albuterol  (VENTOLIN  HFA) 108 (90 Base) MCG/ACT inhaler INHALE 2 PUFFS BY MOUTH EVERY 6 HOURS AS NEEDED FOR WHEEZING FOR SHORTNESS OF BREATH 9 g 0   amLODipine  (NORVASC ) 10 MG tablet Take 1 tablet by mouth once daily 90 tablet 0   atorvastatin  (LIPITOR) 10 MG tablet Take 1 tablet by mouth once daily 90 tablet 0   FARXIGA  10 MG TABS tablet Take 1 tablet by mouth once daily 90 tablet 0    gabapentin  (NEURONTIN ) 300 MG capsule TAKE 1 CAPSULE BY MOUTH FIVE TIMES DAILY 150 capsule 0   metFORMIN  (GLUCOPHAGE ) 850 MG tablet TAKE 1 TABLET BY MOUTH TWICE DAILY WITH A MEAL 180 tablet 0   metoprolol  tartrate (LOPRESSOR ) 100 MG tablet Take 1 tablet by mouth twice daily 180 tablet 0   omeprazole  (PRILOSEC) 40 MG capsule Take 1 capsule by mouth once daily 90 capsule 0   ondansetron  (ZOFRAN -ODT) 4 MG disintegrating tablet DISSOLVE 1 TABLET IN MOUTH EVERY 8 HOURS AS NEEDED FOR NAUSEA FOR VOMITING 20 tablet 0   rOPINIRole  (REQUIP ) 2 MG tablet TAKE 1 TABLET BY MOUTH AT BEDTIME 90 tablet 0   RYBELSUS  14 MG TABS Take 1 tablet by mouth once daily 90 tablet 0   No current facility-administered medications on file prior to visit.   Past Medical History:  Diagnosis Date   Diabetes mellitus without complication (HCC)    Renal cancer [facility])    Past Surgical History:  Procedure Laterality Date   ABDOMINAL HYSTERECTOMY     BREAST BIOPSY Left 08/27/2022   MM LT BREAST BX W LOC DEV 1ST LESION IMAGE BX SPEC STEREO GUIDE 08/27/2022 [facility]   BREAST CYST ASPIRATION Bilateral    CHOLECYSTECTOMY     NEPHRECTOMY     TONSILLECTOMY      Family History  Problem Relation Age of Onset  Breast cancer Maternal Aunt    Hypertension Maternal Grandmother    Diabetes Maternal Grandmother    Hypertension Maternal Grandfather    Diabetes Maternal Grandfather    Hypertension Paternal Grandmother    Diabetes Paternal Grandmother    Hypertension Paternal Grandfather    Social History   Socioeconomic History   Marital status: Married    Spouse name: Not on file   Number of children: Not on file   Years of education: Not on file   Highest education level: Not on file  Occupational History   Not on file  Tobacco Use   Smoking status: Former    Types: Cigarettes   Smokeless tobacco: Not on file   Tobacco comments:    Stopped 12 years ago  Substance and Sexual Activity   Alcohol use: Yes     Comment: occasional   Drug use: No   Sexual activity: Not on file  Other Topics Concern   Not on file  Social History Narrative   Not on file   Social Drivers of Health   Financial Resource Strain: Low Risk  (09/07/2022)   Overall Financial Resource Strain (CARDIA)    Difficulty of Paying Living Expenses: Not hard at all  Food Insecurity: No Food Insecurity (09/07/2022)   Hunger Vital Sign    Worried About Running Out of Food in the Last Year: Never true    Ran Out of Food in the Last Year: Never true  Transportation Needs: No Transportation Needs (09/07/2022)   PRAPARE - Administrator, Civil Service (Medical): No    Lack of Transportation (Non-Medical): No  Physical Activity: Sufficiently Active (09/07/2022)   Exercise Vital Sign    Days of Exercise per Week: 6 days    Minutes of Exercise per Session: 30 min  Stress: No Stress Concern Present (09/07/2022)   [employer] of Occupational Health - Occupational Stress Questionnaire    Feeling of Stress : Not at all  Social Connections: Moderately Isolated (09/07/2022)   Social Connection and Isolation Panel [NHANES]    Frequency of Communication with Friends and Family: More than three times a week    Frequency of Social Gatherings with Friends and Family: More than three times a week    Attends Religious Services: Never    Database Administrator or Organizations: No    Attends Engineer, Structural: Never    Marital Status: Married    CONSTITUTIONAL: Negative for chills, fatigue, fever, unintentional weight gain and unintentional weight loss.  E/N/T: Negative for ear pain, nasal congestion and sore throat.  CARDIOVASCULAR: Negative for chest pain, dizziness, palpitations and pedal edema.  RESPIRATORY: Negative for recent cough and dyspnea.  GASTROINTESTINAL: Negative for abdominal pain, acid reflux symptoms, constipation, diarrhea, nausea and vomiting.  MSK: Negative for arthralgias and myalgias.  INTEGUMENTARY:  Negative for rash.  NEUROLOGICAL: Negative for dizziness and headaches.  PSYCHIATRIC: Negative for sleep disturbance and to question depression screen.  Negative for depression, negative for anhedonia.       Objective:  PHYSICAL EXAM:   VS: BP 128/86 (BP Location: Left Arm, Patient Position: Sitting, Cuff Size: Large)   Pulse 76   Temp 97.9 F (36.6 C) (Temporal)   Resp 18   Ht 5' 4 (1.626 m)   Wt 235 lb (106.6 kg)   SpO2 98%   BMI 40.34 kg/m   GEN: Well nourished, well developed, in no acute distress  Cardiac: RRR; no murmurs, rubs, or gallops,no edema -  no significant varicositiesdistress - normal breath sounds with no rales, rhonchi, wheezes or rubs  MS: no deformity or atrophy  Skin: warm and dry, no rash  Neuro:  Alert and Oriented x 3, Strength and sensation are intact - CN II-Xii grossly intact Psych: euthymic mood, appropriate affect and demeanor  Diabetic Foot Exam - Simple   Simple Foot Form Diabetic Foot exam was performed with the following findings: Yes 04/14/2023  9:34 AM  Visual Inspection No deformities, no ulcerations, no other skin breakdown bilaterally: Yes Sensation Testing Intact to touch and monofilament testing bilaterally: Yes Pulse Check Posterior Tibialis and Dorsalis pulse intact bilaterally: Yes Comments        Lab Results  Component Value Date   WBC 11.3 (H) 09/07/2022   HGB 14.6 09/07/2022   HCT 44.8 09/07/2022   PLT 317 09/07/2022   GLUCOSE 130 (H) 09/13/2022   CHOL 178 09/07/2022   TRIG 366 (H) 09/07/2022   HDL 45 09/07/2022   LDLCALC 75 09/07/2022   ALT 36 (H) 09/13/2022   AST 30 09/13/2022   NA 140 09/13/2022   K 5.7 (H) 09/13/2022   CL 101 09/13/2022   CREATININE 1.08 (H) 09/13/2022   BUN 19 09/13/2022   CO2 19 (L) 09/13/2022   TSH 4.890 (H) 09/07/2022   HGBA1C 7.2 (H) 09/07/2022      Assessment & Plan:   Problem List Items Addressed This Visit       Cardiovascular and Mediastinum   Benign hypertension -  Primary   Relevant Orders   Comprehensive metabolic panel   Lipid panel Continue current meds as directed   Other Visit Diagnoses     Mixed hyperlipidemia       Relevant Orders   Comprehensive metabolic panel   Lipid panel Watch diet Continue meds  NIDDM Continue current meds Watch diet Labwork pending  History of renal cancer Referral to nephrologist- pt given number to contact provider in [facility]  GERD Continue omeprazole  40mg  qd   History of neuropathy Continue neurontin  as directed  Obstructive sleep apnea Use CPAP as directed  Abnormal mammogram Left diagnostic mammogram ordered     .  Meds ordered this encounter  Medications   olmesartan  (BENICAR ) 20 MG tablet    Sig: Take 1 tablet (20 mg total) by mouth daily.    Dispense:  30 tablet    Refill:  5    Supervising Provider:   COX, KIRSTEN [phone]    Orders Placed This Encounter  Procedures   MM 3D DIAGNOSTIC MAMMOGRAM UNILATERAL LEFT BREAST   CBC with Differential/Platelet   Comprehensive metabolic panel   Lipid panel   Hemoglobin A1c   Thyroid  Panel With TSH   VITAMIN D  25 Hydroxy (Vit-D Deficiency, Fractures)     Follow-up: Return in about 4 months (around 08/12/2023) for chronic fasting follow-up.  An After Visit Summary was printed and given to the patient.  [name] [facility] [phone]

## 2023-04-15 ENCOUNTER — Other Ambulatory Visit: Payer: Self-pay | Admitting: Physician Assistant

## 2023-04-15 DIAGNOSIS — E559 Vitamin D deficiency, unspecified: Secondary | ICD-10-CM

## 2023-04-15 LAB — HEMOGLOBIN A1C
Est. average glucose Bld gHb Est-mCnc: 183 mg/dL
Hgb A1c MFr Bld: 8 % — ABNORMAL HIGH (ref 4.8–5.6)

## 2023-04-15 LAB — CBC WITH DIFFERENTIAL/PLATELET
Basophils Absolute: 0.1 10*3/uL (ref 0.0–0.2)
Basos: 1 %
EOS (ABSOLUTE): 0.1 10*3/uL (ref 0.0–0.4)
Eos: 1 %
Hematocrit: 40.3 % (ref 34.0–46.6)
Hemoglobin: 13 g/dL (ref 11.1–15.9)
Immature Grans (Abs): 0 10*3/uL (ref 0.0–0.1)
Immature Granulocytes: 0 %
Lymphocytes Absolute: 2.1 10*3/uL (ref 0.7–3.1)
Lymphs: 27 %
MCH: 26.9 pg (ref 26.6–33.0)
MCHC: 32.3 g/dL (ref 31.5–35.7)
MCV: 83 fL (ref 79–97)
Monocytes Absolute: 0.7 10*3/uL (ref 0.1–0.9)
Monocytes: 9 %
Neutrophils Absolute: 4.9 10*3/uL (ref 1.4–7.0)
Neutrophils: 62 %
Platelets: 291 10*3/uL (ref 150–450)
RBC: 4.83 x10E6/uL (ref 3.77–5.28)
RDW: 14.7 % (ref 11.7–15.4)
WBC: 7.9 10*3/uL (ref 3.4–10.8)

## 2023-04-15 LAB — LIPID PANEL
Chol/HDL Ratio: 4.1 {ratio} (ref 0.0–4.4)
Cholesterol, Total: 169 mg/dL (ref 100–199)
HDL: 41 mg/dL (ref >39)
LDL Chol Calc (NIH): 87 mg/dL (ref 0–99)
Triglycerides: 244 mg/dL — ABNORMAL HIGH (ref 0–149)
VLDL Cholesterol Cal: 41 mg/dL — ABNORMAL HIGH (ref 5–40)

## 2023-04-15 LAB — COMPREHENSIVE METABOLIC PANEL
ALT: 29 [IU]/L (ref 0–32)
AST: 32 [IU]/L (ref 0–40)
Albumin: 4.4 g/dL (ref 3.9–4.9)
Alkaline Phosphatase: 119 [IU]/L (ref 44–121)
BUN/Creatinine Ratio: 22 (ref 9–23)
BUN: 21 mg/dL (ref 6–24)
Bilirubin Total: 0.6 mg/dL (ref 0.0–1.2)
CO2: 21 mmol/L (ref 20–29)
Calcium: 9.8 mg/dL (ref 8.7–10.2)
Chloride: 100 mmol/L (ref 96–106)
Creatinine, Ser: 0.95 mg/dL (ref 0.57–1.00)
Globulin, Total: 2.1 g/dL (ref 1.5–4.5)
Glucose: 167 mg/dL — ABNORMAL HIGH (ref 70–99)
Potassium: 5.1 mmol/L (ref 3.5–5.2)
Sodium: 139 mmol/L (ref 134–144)
Total Protein: 6.5 g/dL (ref 6.0–8.5)
eGFR: 73 mL/min/{1.73_m2} (ref >59)

## 2023-04-15 LAB — THYROID PANEL WITH TSH
Free Thyroxine Index: 2.6 (ref 1.2–4.9)
T3 Uptake Ratio: 29 % (ref 24–39)
T4, Total: 9 ug/dL (ref 4.5–12.0)
TSH: 1.82 u[IU]/mL (ref 0.450–4.500)

## 2023-04-15 LAB — VITAMIN D 25 HYDROXY (VIT D DEFICIENCY, FRACTURES): Vit D, 25-Hydroxy: 18.2 ng/mL — ABNORMAL LOW (ref 30.0–100.0)

## 2023-04-15 MED ORDER — VITAMIN D (ERGOCALCIFEROL) 1.25 MG (50000 UNIT) PO CAPS: 50000.0000 [IU] | ORAL_CAPSULE | ORAL | 5 refills | Status: AC

## 2023-04-17 ENCOUNTER — Other Ambulatory Visit: Payer: Self-pay | Admitting: Physician Assistant

## 2023-04-17 DIAGNOSIS — E1165 Type 2 diabetes mellitus with hyperglycemia: Secondary | ICD-10-CM

## 2023-04-19 ENCOUNTER — Other Ambulatory Visit: Payer: Self-pay | Admitting: Physician Assistant

## 2023-04-19 DIAGNOSIS — E1165 Type 2 diabetes mellitus with hyperglycemia: Secondary | ICD-10-CM

## 2023-04-19 DIAGNOSIS — E782 Mixed hyperlipidemia: Secondary | ICD-10-CM

## 2023-04-19 MED ORDER — METFORMIN HCL 1000 MG PO TABS: 1000.0000 mg | ORAL_TABLET | Freq: Two times a day (BID) | ORAL | 1 refills | Status: DC

## 2023-04-19 MED ORDER — ATORVASTATIN CALCIUM 20 MG PO TABS: 20.0000 mg | ORAL_TABLET | Freq: Every day | ORAL | 1 refills | Status: DC

## 2023-04-29 ENCOUNTER — Other Ambulatory Visit: Payer: Self-pay | Admitting: Physician Assistant

## 2023-04-29 DIAGNOSIS — G629 Polyneuropathy, unspecified: Secondary | ICD-10-CM

## 2023-05-03 ENCOUNTER — Ambulatory Visit: Payer: Self-pay | Admitting: Physician Assistant

## 2023-05-03 ENCOUNTER — Ambulatory Visit: Payer: 59

## 2023-05-03 VITALS — BP 110/80 | HR 58 | Temp 97.6°F | Ht 64.0 in | Wt 231.0 lb

## 2023-05-03 DIAGNOSIS — J029 Acute pharyngitis, unspecified: Secondary | ICD-10-CM

## 2023-05-03 DIAGNOSIS — R112 Nausea with vomiting, unspecified: Secondary | ICD-10-CM

## 2023-05-03 DIAGNOSIS — R6889 Other general symptoms and signs: Secondary | ICD-10-CM

## 2023-05-03 HISTORY — DX: Nausea with vomiting, unspecified: R11.2

## 2023-05-03 LAB — POCT INFLUENZA A/B
Influenza A, POC: NEGATIVE
Influenza B, POC: NEGATIVE

## 2023-05-03 LAB — POCT RAPID STREP A (OFFICE): Rapid Strep A Screen: NEGATIVE

## 2023-05-03 LAB — POC COVID19 BINAXNOW: SARS Coronavirus 2 Ag: NEGATIVE

## 2023-05-03 MED ORDER — MECLIZINE HCL 25 MG PO TABS: 25.0000 mg | ORAL_TABLET | Freq: Three times a day (TID) | ORAL | 0 refills | Status: AC | PRN

## 2023-05-03 MED ORDER — ONDANSETRON 4 MG PO TBDP: 4.0000 mg | ORAL_TABLET | Freq: Three times a day (TID) | ORAL | 0 refills | Status: AC | PRN

## 2023-05-03 NOTE — Progress Notes (Signed)
 Acute Office Visit  Subjective:    Patient ID: Kelly Wilkins, female    DOB: 1975-09-28, 48 y.o.   MRN: [id number]  Chief Complaint  Patient presents with   Cough   Sore Throat    Discussed the use of AI scribe software for clinical note transcription with the patient, who gave verbal consent to proceed.      HPI: Kelly Wilkins is a 48 year old female with diabetes who presents with nausea and vomiting.  She has been experiencing nausea and vomiting for the past four days, beginning on Saturday. She feels weak, tired, and exhausted, with an inability to keep food down. The smell of cooking hamburger meat exacerbates her nausea, leading to vomiting at work. No fever, but she does not have a thermometer to check. Diarrhea occurs after eating.  She experiences dizziness, describing it as 'the room spins' when she turns quickly, which is a new symptom for her.  She reports a sore throat but no other upper respiratory symptoms like a runny nose. Her mother was recently sick, but her tests for COVID, flu, and strep have been negative so far.  She has been taking Zofran to manage her nausea, with her last dose taken this morning. Her past medical history includes diabetes, for which she takes metformin, Farxiga, and Rybelsus. She also takes Ventolin, amlodipine, and Lipitor.  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  Past Medical History:  Diagnosis Date   Diabetes mellitus without complication (HCC)    Renal cancer [facility])     Past Surgical History:  Procedure Laterality Date   ABDOMINAL HYSTERECTOMY     BREAST BIOPSY Left 08/27/2022   MM LT BREAST BX W LOC DEV 1ST LESION IMAGE BX SPEC STEREO GUIDE 08/27/2022 [facility]   BREAST CYST ASPIRATION Bilateral    CHOLECYSTECTOMY     NEPHRECTOMY     TONSILLECTOMY      Family History  Problem Relation Age of Onset   Breast  cancer Maternal Aunt    Hypertension Maternal Grandmother    Diabetes Maternal Grandmother    Hypertension Maternal Grandfather    Diabetes Maternal Grandfather    Hypertension Paternal Grandmother    Diabetes Paternal Grandmother    Hypertension Paternal Grandfather     Social History   Socioeconomic History   Marital status: Married    Spouse name: Not on file   Number of children: Not on file   Years of education: Not on file   Highest education level: Not on file  Occupational History   Not on file  Tobacco Use   Smoking status: Former    Types: Cigarettes   Smokeless tobacco: Not on file   Tobacco comments:    Stopped 12 years ago  Substance and Sexual Activity   Alcohol use: Yes    Comment: occasional   Drug use: No   Sexual activity: Not on file  Other Topics Concern   Not on file  Social History Narrative   Not on file   Social Drivers of Health   Financial Resource Strain: Low Risk  (09/07/2022)   Overall Financial Resource Strain (CARDIA)    Difficulty of Paying Living Expenses: Not hard at all  Food Insecurity: No Food Insecurity (09/07/2022)   Hunger Vital Sign    Worried About Running Out of Food in the Last Year: Never true    Ran  Out of Food in the Last Year: Never true  Transportation Needs: No Transportation Needs (09/07/2022)   PRAPARE - Administrator, Civil Service (Medical): No    Lack of Transportation (Non-Medical): No  Physical Activity: Sufficiently Active (09/07/2022)   Exercise Vital Sign    Days of Exercise per Week: 6 days    Minutes of Exercise per Session: 30 min  Stress: No Stress Concern Present (09/07/2022)   [employer] of Occupational Health - Occupational Stress Questionnaire    Feeling of Stress : Not at all  Social Connections: Moderately Isolated (09/07/2022)   Social Connection and Isolation Panel [NHANES]    Frequency of Communication with Friends and Family: More than three times a week    Frequency of Social  Gatherings with Friends and Family: More than three times a week    Attends Religious Services: Never    Database administrator or Organizations: No    Attends Banker Meetings: Never    Marital Status: Married  Catering manager Violence: Not At Risk (09/07/2022)   Humiliation, Afraid, Rape, and Kick questionnaire    Fear of Current or Ex-Partner: No    Emotionally Abused: No    Physically Abused: No    Sexually Abused: No    Outpatient Medications Prior to Visit  Medication Sig Dispense Refill   albuterol (VENTOLIN HFA) 108 (90 Base) MCG/ACT inhaler INHALE 2 PUFFS BY MOUTH EVERY 6 HOURS AS NEEDED FOR WHEEZING FOR SHORTNESS OF BREATH 9 g 0   amLODipine (NORVASC) 10 MG tablet Take 1 tablet by mouth once daily 90 tablet 0   atorvastatin (LIPITOR) 20 MG tablet Take 1 tablet (20 mg total) by mouth daily. 90 tablet 1   FARXIGA 10 MG TABS tablet Take 1 tablet by mouth once daily 90 tablet 0   gabapentin (NEURONTIN) 300 MG capsule TAKE 1 CAPSULE BY MOUTH FIVE TIMES DAILY 150 capsule 0   metFORMIN (GLUCOPHAGE) 1000 MG tablet Take 1 tablet (1,000 mg total) by mouth 2 (two) times daily with a meal. 180 tablet 1   metoprolol tartrate (LOPRESSOR) 100 MG tablet Take 1 tablet by mouth twice daily 180 tablet 0   olmesartan (BENICAR) 20 MG tablet Take 1 tablet (20 mg total) by mouth daily. 30 tablet 5   omeprazole (PRILOSEC) 40 MG capsule Take 1 capsule by mouth once daily 90 capsule 0   rOPINIRole (REQUIP) 2 MG tablet TAKE 1 TABLET BY MOUTH AT BEDTIME 90 tablet 0   RYBELSUS 14 MG TABS Take 1 tablet by mouth once daily 90 tablet 0   Vitamin D, Ergocalciferol, (DRISDOL) 1.25 MG (50000 UNIT) CAPS capsule Take 1 capsule (50,000 Units total) by mouth every 7 (seven) days. 5 capsule 5   ondansetron (ZOFRAN-ODT) 4 MG disintegrating tablet DISSOLVE 1 TABLET IN MOUTH EVERY 8 HOURS AS NEEDED FOR NAUSEA FOR VOMITING 20 tablet 0   No facility-administered medications prior to visit.    Allergies   Allergen Reactions   Ibuprofen Other (See Comments) and Nausea And Vomiting    Due to history of kidney cancer Other reaction(s): Other (See Comments) Due to history of kidney cancer Other reaction(s): Other (See Comments) Other reaction(s): Other (See Comments) Due to history of kidney cancer Due to history of kidney cancer Due to history of kidney cancer Other reaction(s): Other (See Comments) Due to history of kidney cancer   Lisinopril Cough   Codeine Rash   Penicillins Nausea And Vomiting and Rash   Sulfa  Antibiotics Nausea And Vomiting and Rash    Review of Systems  Constitutional:  Positive for fatigue. Negative for chills and fever.  HENT:  Positive for sore throat. Negative for congestion, ear pain and sinus pain.   Respiratory:  Positive for cough. Negative for shortness of breath.   Cardiovascular:  Negative for chest pain.  Gastrointestinal:  Positive for nausea and vomiting. Negative for abdominal pain, constipation and diarrhea.  Musculoskeletal:  Negative for myalgias.  Neurological:  Positive for dizziness and headaches.       Objective:        05/03/2023    9:49 AM 04/14/2023    8:31 AM 12/02/2022    7:58 AM  Vitals with BMI  Height 5\' 4"  5\' 4"  5\' 4"   Weight 231 lbs 235 lbs 235 lbs  BMI 39.63 40.32 40.32  Systolic 110 128 161  Diastolic 80 86 76  Pulse 58 76 96    No data found.   Physical Exam Vitals and nursing note reviewed.  Constitutional:      Appearance: She is obese.  HENT:     Head:     Comments: HEENT: Throat without redness or white patches. Oral cavity normal. Cerumen present in both ears. NECK: Cervical lymphadenopathy present. CHEST: Lungs clear to auscultation bilaterally. CARDIOVASCULAR: Heart sounds normal. ABDOMEN: Abdomen uncomfortable but not painful. Neurological:     Mental Status: She is alert.     Health Maintenance Due  Topic Date Due   OPHTHALMOLOGY EXAM  Never done    There are no preventive care reminders  to display for this patient.   Lab Results  Component Value Date   TSH 1.820 04/14/2023   Lab Results  Component Value Date   WBC 7.9 04/14/2023   HGB 13.0 04/14/2023   HCT 40.3 04/14/2023   MCV 83 04/14/2023   PLT 291 04/14/2023   Lab Results  Component Value Date   NA 139 04/14/2023   K 5.1 04/14/2023   CO2 21 04/14/2023   GLUCOSE 167 (H) 04/14/2023   BUN 21 04/14/2023   CREATININE 0.95 04/14/2023   BILITOT 0.6 04/14/2023   ALKPHOS 119 04/14/2023   AST 32 04/14/2023   ALT 29 04/14/2023   PROT 6.5 04/14/2023   ALBUMIN 4.4 04/14/2023   CALCIUM 9.8 04/14/2023   EGFR 73 04/14/2023   Lab Results  Component Value Date   CHOL 169 04/14/2023   Lab Results  Component Value Date   HDL 41 04/14/2023   Lab Results  Component Value Date   LDLCALC 87 04/14/2023   Lab Results  Component Value Date   TRIG 244 (H) 04/14/2023   Lab Results  Component Value Date   CHOLHDL 4.1 04/14/2023   Lab Results  Component Value Date   HGBA1C 8.0 (H) 04/14/2023       Assessment & Plan:  Sore throat -     POC COVID-19 BinaxNow -     POCT Influenza A/B -     POCT rapid strep A  Flu-like symptoms -     POC COVID-19 BinaxNow -     POCT Influenza A/B -     POCT rapid strep A  Nausea and vomiting, unspecified vomiting type Assessment & Plan: Four days of nausea, vomiting, and dizziness. Negative for COVID, flu, and strep. Likely viral gastroenteritis, possibly norovirus, given symptoms and recent exposure. No fever. Mild abdominal discomfort without significant pain. No upper respiratory infection signs other than a sore throat. Physical exam unremarkable.  Discussed Zofran for nausea/vomiting and meclizine for dizziness/nausea.   Advised hydration with zero sugar Gatorade or Powerade due to diabetes.   Recommended rest and staying home from work to prevent contagion.  - Prescribe Zofran 4 mg oral every eight hours as needed for three days - Prescribe meclizine for  dizziness and nausea - Advise hydration with zero sugar Gatorade or Powerade - Recommend rest and staying home from work until 2/27, with return to work on 2/28 if feeling better   Other orders -     Ondansetron; Take 1 tablet (4 mg total) by mouth every 8 (eight) hours as needed for up to 3 days for nausea or vomiting.  Dispense: 9 tablet; Refill: 0 -     Meclizine HCl; Take 1 tablet (25 mg total) by mouth 3 (three) times daily as needed for up to 5 days for dizziness or nausea.  Dispense: 15 tablet; Refill: 0     Meds ordered this encounter  Medications   ondansetron (ZOFRAN-ODT) 4 MG disintegrating tablet    Sig: Take 1 tablet (4 mg total) by mouth every 8 (eight) hours as needed for up to 3 days for nausea or vomiting.    Dispense:  9 tablet    Refill:  0   meclizine (ANTIVERT) 25 MG tablet    Sig: Take 1 tablet (25 mg total) by mouth 3 (three) times daily as needed for up to 5 days for dizziness or nausea.    Dispense:  15 tablet    Refill:  0    Orders Placed This Encounter  Procedures   POC COVID-19   Influenza A/B   Rapid Strep A     Follow-up: Return if symptoms worsen or fail to improve.  An After Visit Summary was printed and given to the patient.  Windell Moment, MD [facility] [phone]

## 2023-05-03 NOTE — Assessment & Plan Note (Signed)
 Four days of nausea, vomiting, and dizziness. Negative for COVID, flu, and strep. Likely viral gastroenteritis, possibly norovirus, given symptoms and recent exposure. No fever. Mild abdominal discomfort without significant pain. No upper respiratory infection signs other than a sore throat. Physical exam unremarkable.   Discussed Zofran for nausea/vomiting and meclizine for dizziness/nausea.   Advised hydration with zero sugar Gatorade or Powerade due to diabetes.   Recommended rest and staying home from work to prevent contagion.  - Prescribe Zofran 4 mg oral every eight hours as needed for three days - Prescribe meclizine for dizziness and nausea - Advise hydration with zero sugar Gatorade or Powerade - Recommend rest and staying home from work until 2/27, with return to work on 2/28 if feeling better

## 2023-05-03 NOTE — Patient Instructions (Signed)
 VISIT SUMMARY:  During today's visit, we discussed your recent symptoms of nausea, vomiting, and dizziness, which have been ongoing for the past four days. We also reviewed your diabetes management and general health maintenance.  YOUR PLAN:  -GASTROENTERITIS: Gastroenteritis is an inflammation of the stomach and intestines, often caused by a viral infection like norovirus. You are experiencing nausea, vomiting, and dizziness, likely due to this condition. We have prescribed Zofran to help with nausea and vomiting, and meclizine for dizziness. It's important to stay hydrated with zero sugar Gatorade or Powerade and to rest at home to prevent spreading the illness. Please stay home from work until 2/27 and return on 2/28 if you are feeling better.  -DIABETES MELLITUS: Diabetes Mellitus is a condition where your blood sugar levels are higher than normal. You are currently managing this with metformin, Farxiga, and Rybelsus. Continue taking your medications as prescribed and use zero sugar Gatorade for hydration to help manage your blood sugar levels.  -GENERAL HEALTH MAINTENANCE: For general health maintenance, we discussed using Debrox ear drops to help with ear wax removal.  INSTRUCTIONS:  Please stay home from work until 2/27 and return on 2/28 if you are feeling better. Check for any updates on your COVID, flu, and strep test results before leaving the clinic.

## 2023-05-03 NOTE — Telephone Encounter (Signed)
 Red Word that prompted transfer to Nurse Triage: vomiting multiple times this morning, sore throat, coughing, headache, nausea is really bad.     Chief Complaint: Nausea, cough, sore throat. Concerned about COVID, flu. Symptoms: Above Frequency: Weekend Pertinent Negatives: Patient denies fever today Disposition: [] ED /[] Urgent Care (no appt availability in office) / [x] Appointment(In office/virtual)/ []  [facility]/ [] Home Care/ [] Refused Recommended Disposition /[] [facility]/ []  Follow-up with PCP Additional Notes: Pt. Agrees with appointment.  Reason for Disposition  Fever present > 3 days (72 hours)  Answer Assessment - Initial Assessment Questions 1. NAUSEA SEVERITY: "How bad is the nausea?" (e.g., mild, moderate, severe; dehydration, weight loss)   - MILD: loss of appetite without change in eating habits   - MODERATE: decreased oral intake without significant weight loss, dehydration, or malnutrition   - SEVERE: inadequate caloric or fluid intake, significant weight loss, symptoms of dehydration     Mild 2. ONSET: "When did the nausea begin?"     Weekend 3. VOMITING: "Any vomiting?" If Yes, ask: "How many times today?"     Yes -  4. RECURRENT SYMPTOM: "Have you had nausea before?" If Yes, ask: "When was the last time?" "What happened that time?"     Yes 5. CAUSE: "What do you think is causing the nausea?"     Cough, sore throat, headache 6. PREGNANCY: "Is there any chance you are pregnant?" (e.g., unprotected intercourse, missed birth control pill, broken condom)     No  Protocols used: Nausea-A-AH

## 2023-05-10 ENCOUNTER — Encounter: Payer: Self-pay | Admitting: Physician Assistant

## 2023-05-10 ENCOUNTER — Ambulatory Visit: Admitting: Physician Assistant

## 2023-05-10 VITALS — BP 118/80 | HR 110 | Temp 97.9°F | Resp 18 | Ht 64.0 in | Wt 237.0 lb

## 2023-05-10 DIAGNOSIS — J069 Acute upper respiratory infection, unspecified: Secondary | ICD-10-CM

## 2023-05-10 DIAGNOSIS — J101 Influenza due to other identified influenza virus with other respiratory manifestations: Secondary | ICD-10-CM

## 2023-05-10 HISTORY — DX: Acute upper respiratory infection, unspecified: J06.9

## 2023-05-10 HISTORY — DX: Influenza due to other identified influenza virus with other respiratory manifestations: J10.1

## 2023-05-10 LAB — POCT FLU A/B STATUS
Influenza A, POC: POSITIVE — AB
Influenza B, POC: NEGATIVE

## 2023-05-10 LAB — POC COVID19 BINAXNOW: SARS Coronavirus 2 Ag: NEGATIVE

## 2023-05-10 MED ORDER — PROMETHAZINE-DM 6.25-15 MG/5ML PO SYRP: 5.0000 mL | ORAL_SOLUTION | Freq: Four times a day (QID) | ORAL | 0 refills | Status: DC | PRN

## 2023-05-10 MED ORDER — OSELTAMIVIR PHOSPHATE 75 MG PO CAPS: 75.0000 mg | ORAL_CAPSULE | Freq: Two times a day (BID) | ORAL | 0 refills | Status: DC

## 2023-05-10 NOTE — Progress Notes (Signed)
 Acute Office Visit  Subjective:    Patient ID: Kelly Wilkins, female    DOB: 01/17/1976, 48 y.o.   MRN: [id number]  Chief Complaint  Patient presents with   Cough   Nasal Congestion   Diarrhea   Emesis   Nausea    HPI: Patient is in today for complaints of sudden onset of fever, dry cough, congestion , malaise and headache since yesterday.  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   Current Outpatient Medications:    albuterol (VENTOLIN HFA) 108 (90 Base) MCG/ACT inhaler, INHALE 2 PUFFS BY MOUTH EVERY 6 HOURS AS NEEDED FOR WHEEZING FOR SHORTNESS OF BREATH, Disp: 9 g, Rfl: 0   amLODipine (NORVASC) 10 MG tablet, Take 1 tablet by mouth once daily, Disp: 90 tablet, Rfl: 0   atorvastatin (LIPITOR) 20 MG tablet, Take 1 tablet (20 mg total) by mouth daily., Disp: 90 tablet, Rfl: 1   FARXIGA 10 MG TABS tablet, Take 1 tablet by mouth once daily, Disp: 90 tablet, Rfl: 0   gabapentin (NEURONTIN) 300 MG capsule, TAKE 1 CAPSULE BY MOUTH FIVE TIMES DAILY, Disp: 150 capsule, Rfl: 0   metFORMIN (GLUCOPHAGE) 1000 MG tablet, Take 1 tablet (1,000 mg total) by mouth 2 (two) times daily with a meal., Disp: 180 tablet, Rfl: 1   metoprolol tartrate (LOPRESSOR) 100 MG tablet, Take 1 tablet by mouth twice daily, Disp: 180 tablet, Rfl: 0   olmesartan (BENICAR) 20 MG tablet, Take 1 tablet (20 mg total) by mouth daily., Disp: 30 tablet, Rfl: 5   omeprazole (PRILOSEC) 40 MG capsule, Take 1 capsule by mouth once daily, Disp: 90 capsule, Rfl: 0   oseltamivir (TAMIFLU) 75 MG capsule, Take 1 capsule (75 mg total) by mouth 2 (two) times daily., Disp: 10 capsule, Rfl: 0   promethazine-dextromethorphan (PROMETHAZINE-DM) 6.25-15 MG/5ML syrup, Take 5 mLs by mouth 4 (four) times daily as needed., Disp: 118 mL, Rfl: 0   rOPINIRole (REQUIP) 2 MG tablet, TAKE 1 TABLET BY MOUTH AT BEDTIME, Disp: 90 tablet, Rfl: 0   RYBELSUS 14 MG TABS, Take 1 tablet by mouth once daily,  Disp: 90 tablet, Rfl: 0   Vitamin D, Ergocalciferol, (DRISDOL) 1.25 MG (50000 UNIT) CAPS capsule, Take 1 capsule (50,000 Units total) by mouth every 7 (seven) days., Disp: 5 capsule, Rfl: 5  Allergies  Allergen Reactions   Ibuprofen Other (See Comments) and Nausea And Vomiting    Due to history of kidney cancer Other reaction(s): Other (See Comments) Due to history of kidney cancer Other reaction(s): Other (See Comments) Other reaction(s): Other (See Comments) Due to history of kidney cancer Due to history of kidney cancer Due to history of kidney cancer Other reaction(s): Other (See Comments) Due to history of kidney cancer   Lisinopril Cough   Codeine Rash   Penicillins Nausea And Vomiting and Rash   Sulfa Antibiotics Nausea And Vomiting and Rash    ROS CONSTITUTIONAL:see HPI E/N/T: see HPI CARDIOVASCULAR: Negative for chest pain, dizziness, palpitations and pedal edema.  RESPIRATORY: see HPI GASTROINTESTINAL: Negative for abdominal pain, acid reflux symptoms, constipation, diarrhea, nausea and vomiting.       Objective:    PHYSICAL EXAM:   BP 118/80 (BP Location: Left Arm, Patient Position: Sitting, Cuff Size: Large)   Pulse (!) 110   Temp 97.9 F (36.6 C) (Temporal)   Resp 18   Ht 5\' 4"  (1.626 m)   Wt 237 lb (107.5 kg)  SpO2 98%   BMI 40.68 kg/m    GEN: Well nourished, well developed, acutely ill HEENT: normal external ears and nose - normal external auditory canals and TMS -  - Lips, Teeth and Gums - normal  Oropharynx - minimal erythema Cardiac: RRR; no murmurs, rubs,  Respiratory:  normal respiratory rate and pattern with no distress - normal breath sounds with no rales, rhonchi, wheezes or rubs  Office Visit on 05/10/2023  Component Date Value Ref Range Status   SARS Coronavirus 2 Ag 05/10/2023 Negative  Negative Final   Influenza A, POC 05/10/2023 Positive (A)  Negative Final   Influenza B, POC 05/10/2023 Negative  Negative Final         Assessment & Plan:    Acute upper respiratory infection -     POC COVID-19 BinaxNow -     POCT Flu A & B Status -     Promethazine-DM; Take 5 mLs by mouth 4 (four) times daily as needed.  Dispense: 118 mL; Refill: 0  Influenza A -     Oseltamivir Phosphate; Take 1 capsule (75 mg total) by mouth 2 (two) times daily.  Dispense: 10 capsule; Refill: 0   Rest, fluids   Follow-up: Return if symptoms worsen or fail to improve.  An After Visit Summary was printed and given to the patient.  Jettie Pagan [facility] [phone]

## 2023-05-17 ENCOUNTER — Other Ambulatory Visit: Payer: Self-pay | Admitting: Physician Assistant

## 2023-05-17 DIAGNOSIS — I10 Essential (primary) hypertension: Secondary | ICD-10-CM

## 2023-05-22 ENCOUNTER — Other Ambulatory Visit: Payer: Self-pay | Admitting: Physician Assistant

## 2023-05-22 DIAGNOSIS — E782 Mixed hyperlipidemia: Secondary | ICD-10-CM

## 2023-05-23 ENCOUNTER — Telehealth: Payer: Self-pay

## 2023-05-23 ENCOUNTER — Other Ambulatory Visit: Payer: Self-pay | Admitting: Physician Assistant

## 2023-05-23 DIAGNOSIS — E782 Mixed hyperlipidemia: Secondary | ICD-10-CM

## 2023-05-23 MED ORDER — ATORVASTATIN CALCIUM 20 MG PO TABS: 20.0000 mg | ORAL_TABLET | Freq: Every day | ORAL | 0 refills | Status: AC

## 2023-05-23 NOTE — Telephone Encounter (Signed)
 Copied from CRM [id number]. 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

## 2023-05-23 NOTE — Telephone Encounter (Signed)
 This pt was seen twice in November by Dr Faylene Kurtz once and Lajuana Matte once Would need the dates that she needs covered and have appropriate provider send the work note

## 2023-05-24 ENCOUNTER — Encounter: Payer: Self-pay | Admitting: Family Medicine

## 2023-05-24 NOTE — Telephone Encounter (Signed)
 Letter done and faxed to [phone] to [facility] as requested

## 2023-05-27 ENCOUNTER — Other Ambulatory Visit: Payer: Self-pay | Admitting: Physician Assistant

## 2023-05-27 DIAGNOSIS — G629 Polyneuropathy, unspecified: Secondary | ICD-10-CM

## 2023-06-27 ENCOUNTER — Other Ambulatory Visit: Payer: Self-pay | Admitting: Physician Assistant

## 2023-06-27 DIAGNOSIS — G629 Polyneuropathy, unspecified: Secondary | ICD-10-CM

## 2023-06-29 ENCOUNTER — Other Ambulatory Visit: Payer: Self-pay | Admitting: Physician Assistant

## 2023-06-29 DIAGNOSIS — I10 Essential (primary) hypertension: Secondary | ICD-10-CM

## 2023-07-01 ENCOUNTER — Other Ambulatory Visit: Payer: Self-pay | Admitting: Physician Assistant

## 2023-07-01 DIAGNOSIS — K219 Gastro-esophageal reflux disease without esophagitis: Secondary | ICD-10-CM

## 2023-07-20 ENCOUNTER — Other Ambulatory Visit: Payer: Self-pay | Admitting: Physician Assistant

## 2023-07-20 DIAGNOSIS — E1165 Type 2 diabetes mellitus with hyperglycemia: Secondary | ICD-10-CM

## 2023-07-25 ENCOUNTER — Other Ambulatory Visit: Payer: Self-pay | Admitting: Physician Assistant

## 2023-07-30 ENCOUNTER — Other Ambulatory Visit: Payer: Self-pay | Admitting: Physician Assistant

## 2023-07-30 DIAGNOSIS — G629 Polyneuropathy, unspecified: Secondary | ICD-10-CM

## 2023-08-15 ENCOUNTER — Ambulatory Visit: Payer: 59 | Admitting: Physician Assistant

## 2023-08-20 ENCOUNTER — Other Ambulatory Visit: Payer: Self-pay | Admitting: Physician Assistant

## 2023-08-20 DIAGNOSIS — I10 Essential (primary) hypertension: Secondary | ICD-10-CM

## 2023-08-20 DIAGNOSIS — E1165 Type 2 diabetes mellitus with hyperglycemia: Secondary | ICD-10-CM

## 2023-08-21 DIAGNOSIS — E1142 Type 2 diabetes mellitus with diabetic polyneuropathy: Secondary | ICD-10-CM

## 2023-08-21 DIAGNOSIS — I10 Essential (primary) hypertension: Secondary | ICD-10-CM

## 2023-08-21 DIAGNOSIS — I1A0 Resistant hypertension: Secondary | ICD-10-CM | POA: Insufficient documentation

## 2023-08-21 DIAGNOSIS — C649 Malignant neoplasm of unspecified kidney, except renal pelvis: Secondary | ICD-10-CM | POA: Insufficient documentation

## 2023-08-21 HISTORY — DX: Essential (primary) hypertension: I10

## 2023-08-21 HISTORY — DX: Type 2 diabetes mellitus with diabetic polyneuropathy: E11.42

## 2023-08-22 ENCOUNTER — Other Ambulatory Visit: Payer: Self-pay | Admitting: Physician Assistant

## 2023-08-22 DIAGNOSIS — E1165 Type 2 diabetes mellitus with hyperglycemia: Secondary | ICD-10-CM

## 2023-08-22 MED ORDER — METFORMIN HCL 1000 MG PO TABS
1000.0000 mg | ORAL_TABLET | Freq: Two times a day (BID) | ORAL | 1 refills | Status: AC
Start: 2023-08-22 — End: ?

## 2023-08-28 ENCOUNTER — Other Ambulatory Visit: Payer: Self-pay | Admitting: Physician Assistant

## 2023-08-28 DIAGNOSIS — G629 Polyneuropathy, unspecified: Secondary | ICD-10-CM

## 2023-09-08 ENCOUNTER — Ambulatory Visit: Admitting: Physician Assistant

## 2023-09-09 ENCOUNTER — Other Ambulatory Visit: Payer: Self-pay | Admitting: Physician Assistant

## 2023-09-09 DIAGNOSIS — E1165 Type 2 diabetes mellitus with hyperglycemia: Secondary | ICD-10-CM

## 2023-09-11 ENCOUNTER — Other Ambulatory Visit: Payer: Self-pay | Admitting: Physician Assistant

## 2023-09-11 DIAGNOSIS — G2581 Restless legs syndrome: Secondary | ICD-10-CM

## 2023-09-12 ENCOUNTER — Other Ambulatory Visit: Payer: Self-pay | Admitting: Physician Assistant

## 2023-09-12 DIAGNOSIS — G2581 Restless legs syndrome: Secondary | ICD-10-CM

## 2023-09-12 MED ORDER — ROPINIROLE HCL 2 MG PO TABS
2.0000 mg | ORAL_TABLET | Freq: Every day | ORAL | 0 refills | Status: DC
Start: 2023-09-12 — End: 2023-12-19

## 2023-09-12 NOTE — Telephone Encounter (Signed)
 Copied from CRM [id number]. Topic: Clinical - Medication Refill >> Sep 12, 2023 10:45 AM Charlet HERO wrote: Medication: rOPINIRole  (REQUIP ) 2 MG tablet  Has the patient contacted their pharmacy? Yes Would need to call in  This is the patient's preferred pharmacy:  [facility] [address] [place] - [address] [place] [address] Phone: [phone] Fax: [phone]  Is this the correct pharmacy for this prescription? Yes If no, delete pharmacy and type the correct one.   Has the prescription been filled recently? Yes  Is the patient out of the medication? Yes  Has the patient been seen for an appointment in the last year OR does the patient have an upcoming appointment? Yes  Can we respond through MyChart? No  Agent: Please be advised that Rx refills may take up to 3 business days. We ask that you follow-up with your pharmacy.

## 2023-09-21 ENCOUNTER — Encounter: Payer: Self-pay | Admitting: Physician Assistant

## 2023-09-21 ENCOUNTER — Encounter: Admitting: Physician Assistant

## 2023-09-26 ENCOUNTER — Other Ambulatory Visit: Payer: Self-pay | Admitting: Family Medicine

## 2023-09-26 DIAGNOSIS — G629 Polyneuropathy, unspecified: Secondary | ICD-10-CM

## 2023-10-04 ENCOUNTER — Other Ambulatory Visit: Payer: Self-pay | Admitting: Physician Assistant

## 2023-10-04 DIAGNOSIS — I10 Essential (primary) hypertension: Secondary | ICD-10-CM

## 2023-10-04 DIAGNOSIS — K219 Gastro-esophageal reflux disease without esophagitis: Secondary | ICD-10-CM

## 2023-10-05 NOTE — Progress Notes (Signed)
 This encounter was created in error - please disregard.

## 2023-10-18 ENCOUNTER — Other Ambulatory Visit: Payer: Self-pay | Admitting: Physician Assistant

## 2023-10-18 DIAGNOSIS — I10 Essential (primary) hypertension: Secondary | ICD-10-CM

## 2023-10-19 DIAGNOSIS — M79672 Pain in left foot: Secondary | ICD-10-CM | POA: Insufficient documentation

## 2023-10-19 HISTORY — DX: Pain in left foot: M79.672

## 2023-10-24 ENCOUNTER — Ambulatory Visit: Admitting: Family Medicine

## 2023-10-24 ENCOUNTER — Encounter: Payer: Self-pay | Admitting: Family Medicine

## 2023-10-24 ENCOUNTER — Ambulatory Visit: Payer: Self-pay

## 2023-10-24 VITALS — BP 160/100 | HR 106 | Temp 98.1°F | Ht 64.0 in | Wt 215.0 lb

## 2023-10-24 DIAGNOSIS — S5002XD Contusion of left elbow, subsequent encounter: Secondary | ICD-10-CM

## 2023-10-24 DIAGNOSIS — S92902A Unspecified fracture of left foot, initial encounter for closed fracture: Secondary | ICD-10-CM | POA: Diagnosis not present

## 2023-10-24 DIAGNOSIS — I10 Essential (primary) hypertension: Secondary | ICD-10-CM | POA: Diagnosis not present

## 2023-10-24 DIAGNOSIS — K219 Gastro-esophageal reflux disease without esophagitis: Secondary | ICD-10-CM | POA: Diagnosis not present

## 2023-10-24 DIAGNOSIS — G2581 Restless legs syndrome: Secondary | ICD-10-CM

## 2023-10-24 DIAGNOSIS — S82832A Other fracture of upper and lower end of left fibula, initial encounter for closed fracture: Secondary | ICD-10-CM | POA: Diagnosis not present

## 2023-10-24 MED ORDER — OLMESARTAN MEDOXOMIL 20 MG PO TABS
20.0000 mg | ORAL_TABLET | Freq: Every day | ORAL | 5 refills | Status: DC
Start: 2023-10-24 — End: 2023-11-01

## 2023-10-24 MED ORDER — TRAMADOL HCL 50 MG PO TABS: ORAL_TABLET | ORAL | 0 refills | Status: DC

## 2023-10-24 MED ORDER — OMEPRAZOLE 40 MG PO CPDR
40.0000 mg | DELAYED_RELEASE_CAPSULE | Freq: Every day | ORAL | 0 refills | Status: DC
Start: 2023-10-24 — End: 2024-01-17

## 2023-10-24 NOTE — Telephone Encounter (Signed)
 FYI Only or Action Required?: FYI only for provider.  Patient was last seen in primary care on 05/10/2023 by Nicholaus Credit, PA-C.  Called Nurse Triage reporting Leg Pain.  Symptoms began several days ago.  Interventions attempted: Prescription medications: pain medication, but states it makes her nauseous.  Symptoms are: gradually worsening.  Triage Disposition: See HCP Within 4 Hours (Or PCP Triage)  Patient/caregiver understands and will follow disposition?: yes  Copied from CRM #[id number]. Topic: Clinical - Red Word Triage >> Oct 24, 2023 11:30 AM Montie POUR wrote: Red Word that prompted transfer to Nurse Triage:  She fell 2 weeks ago and hurt her left leg - 2 fractures in foot and 1 fracture in fibula She is in pain and level is an 8; The pain medication that she was given, she throws it up Reason for Disposition  [1] SEVERE pain (e.g., excruciating pain, unable to do any normal activities) AND [2] not improved 2 hours after pain medicine/ice packs  Answer Assessment - Initial Assessment Questions 1. ONSET: When did the pain start?      Pt states keeps getting worse,  2. LOCATION: Where is the pain located?      L leg 3. PAIN: How bad is the pain?    (Scale 1-10; or mild, moderate, severe)     8 4. WORK OR EXERCISE: Has there been any recent work or exercise that involved this part of the body?      Recent fx, 5. CAUSE: What do you think is causing the leg pain?     Was told she has fxs 6. OTHER SYMPTOMS: Do you have any other symptoms? (e.g., chest pain, back pain, breathing difficulty, swelling, rash, fever, numbness, weakness)     Denies  Pt denies worsening swelling nor redness, denies SOB, but states that she had an episode of cold sweat this morning, pt PCP does not have openings today. Pt scheduled with PCP clinic today.  Protocols used: Leg Pain-A-AH, Leg Injury-A-AH

## 2023-10-24 NOTE — Progress Notes (Unsigned)
 Acute Office Visit  Subjective:    Patient ID: Kelly Wilkins, female    DOB: 11-30-1975, 48 y.o.   MRN: [id number]  No chief complaint on file.   HPI: Patient is in today for left leg pain, fell at home and fractured her left fibula and left great toe on 8/7.  Was seen in the ED 10/15/23 at [facility]. States vicodin is causing her to vomit, has continued to take medcation.  History of Present Illness The patient presents with pain and functional limitations following a fall resulting in fractures.  Musculoskeletal pain and fractures - Sustained injury on August 7th after slipping on a handicap ramp in the rain, twisting her leg underneath her and catching the rail with her arm - Unable to ambulate following the fall - Diagnosed on August 10th with a proximal fibula fracture and a nondisplaced fracture of the great toe involving two bones - Significant pain in the leg, knee, and elbow, characterized as a strong building sensation and electric shock in certain areas - Extensive bruising of the elbow after the fall  Pain management limitations - Difficulty managing pain with Vicodin due to nausea and vomiting - Unable to use NSAIDs due to history of kidney cancer and having only one kidney - Previously tolerated tramadol  without issues and is considering it for pain management - Tried a gummy for rest  Functional impairment and occupational limitations - Employed as a lunch lady Production designer, theatre/television/film - Unable to rest her foot or perform work duties effectively - Desires to work but finds it difficult due to current condition  Elevated blood pressure - Blood pressure, usually controlled, is currently elevated, likely secondary to pain  Restless leg syndrome and sleep disturbance - Experiences restless leg syndrome, affecting sleep quality - Husband avoids sleeping with her due to her movements  Past Medical History:  Diagnosis Date   Diabetes mellitus without complication (HCC)    Renal cancer  (HCC)     Past Surgical History:  Procedure Laterality Date   ABDOMINAL HYSTERECTOMY     BREAST BIOPSY Left 08/27/2022   MM LT BREAST BX W LOC DEV 1ST LESION IMAGE BX SPEC STEREO GUIDE 08/27/2022 [facility]   BREAST CYST ASPIRATION Bilateral    CHOLECYSTECTOMY     NEPHRECTOMY     TONSILLECTOMY      Family History  Problem Relation Age of Onset   Breast cancer Maternal Aunt    Hypertension Maternal Grandmother    Diabetes Maternal Grandmother    Hypertension Maternal Grandfather    Diabetes Maternal Grandfather    Hypertension Paternal Grandmother    Diabetes Paternal Grandmother    Hypertension Paternal Grandfather     Social History   Socioeconomic History   Marital status: Married    Spouse name: Not on file   Number of children: Not on file   Years of education: Not on file   Highest education level: Not on file  Occupational History   Not on file  Tobacco Use   Smoking status: Former    Types: Cigarettes   Smokeless tobacco: Not on file   Tobacco comments:    Stopped 12 years ago  Substance and Sexual Activity   Alcohol use: Yes    Comment: occasional   Drug use: No   Sexual activity: Not on file  Other Topics Concern   Not on file  Social History Narrative   Not on file   Social Drivers of Health   Financial Resource Strain: Low  Risk  (09/07/2022)   Overall Financial Resource Strain (CARDIA)    Difficulty of Paying Living Expenses: Not hard at all  Food Insecurity: No Food Insecurity (10/24/2023)   Hunger Vital Sign    Worried About Running Out of Food in the Last Year: Never true    Ran Out of Food in the Last Year: Never true  Transportation Needs: No Transportation Needs (10/24/2023)   PRAPARE - Administrator, Civil Service (Medical): No    Lack of Transportation (Non-Medical): No  Physical Activity: Sufficiently Active (09/07/2022)   Exercise Vital Sign    Days of Exercise per Week: 6 days    Minutes of Exercise per Session: 30  min  Stress: No Stress Concern Present (09/07/2022)   [employer] of Occupational Health - Occupational Stress Questionnaire    Feeling of Stress : Not at all  Social Connections: Moderately Isolated (09/07/2022)   Social Connection and Isolation Panel    Frequency of Communication with Friends and Family: More than three times a week    Frequency of Social Gatherings with Friends and Family: More than three times a week    Attends Religious Services: Never    Database administrator or Organizations: No    Attends Banker Meetings: Never    Marital Status: Married  Catering manager Violence: Not At Risk (10/24/2023)   Humiliation, Afraid, Rape, and Kick questionnaire    Fear of Current or Ex-Partner: No    Emotionally Abused: No    Physically Abused: No    Sexually Abused: No    Outpatient Medications Prior to Visit  Medication Sig Dispense Refill   HYDROcodone-acetaminophen (NORCO) 7.5-325 MG tablet Take 1 tablet by mouth every 8 (eight) hours as needed.     albuterol  (VENTOLIN  HFA) 108 (90 Base) MCG/ACT inhaler INHALE 2 PUFFS BY MOUTH EVERY 6 HOURS AS NEEDED FOR WHEEZING FOR SHORTNESS OF BREATH 9 g 0   amLODipine  (NORVASC ) 10 MG tablet Take 1 tablet by mouth once daily 90 tablet 0   atorvastatin  (LIPITOR) 20 MG tablet Take 1 tablet (20 mg total) by mouth daily. 90 tablet 0   FARXIGA  10 MG TABS tablet Take 1 tablet by mouth once daily 90 tablet 0   gabapentin  (NEURONTIN ) 300 MG capsule TAKE 1 CAPSULE BY MOUTH FIVE TIMES DAILY 150 capsule 0   metFORMIN  (GLUCOPHAGE ) 1000 MG tablet Take 1 tablet (1,000 mg total) by mouth 2 (two) times daily with a meal. 180 tablet 1   metoprolol  tartrate (LOPRESSOR ) 100 MG tablet Take 1 tablet by mouth twice daily 180 tablet 0   olmesartan  (BENICAR ) 20 MG tablet Take 1 tablet (20 mg total) by mouth daily. 30 tablet 5   omeprazole  (PRILOSEC) 40 MG capsule Take 1 capsule by mouth once daily 90 capsule 0   ondansetron  (ZOFRAN -ODT) 4 MG  disintegrating tablet DISSOLVE 1 TABLET IN MOUTH EVERY 8 HOURS AS NEEDED FOR NAUSEA FOR VOMITING 18 tablet 0   rOPINIRole  (REQUIP ) 2 MG tablet Take 1 tablet (2 mg total) by mouth at bedtime. 90 tablet 0   RYBELSUS  14 MG TABS Take 1 tablet by mouth once daily 90 tablet 0   Vitamin D , Ergocalciferol , (DRISDOL ) 1.25 MG (50000 UNIT) CAPS capsule Take 1 capsule (50,000 Units total) by mouth every 7 (seven) days. 5 capsule 5   oseltamivir  (TAMIFLU ) 75 MG capsule Take 1 capsule (75 mg total) by mouth 2 (two) times daily. 10 capsule 0   promethazine -dextromethorphan  (PROMETHAZINE -DM) 6.25-15 MG/5ML  syrup Take 5 mLs by mouth 4 (four) times daily as needed. 118 mL 0   No facility-administered medications prior to visit.    Allergies  Allergen Reactions   Ibuprofen Other (See Comments) and Nausea And Vomiting    Due to history of kidney cancer Other reaction(s): Other (See Comments) Due to history of kidney cancer Other reaction(s): Other (See Comments) Other reaction(s): Other (See Comments) Due to history of kidney cancer Due to history of kidney cancer Due to history of kidney cancer Other reaction(s): Other (See Comments) Due to history of kidney cancer   Lisinopril Cough   Codeine Rash   Penicillins Nausea And Vomiting and Rash   Sulfa Antibiotics Nausea And Vomiting and Rash    Review of Systems  Constitutional:  Negative for chills, fatigue and fever.  HENT:  Negative for congestion, ear pain, rhinorrhea and sore throat.   Respiratory:  Negative for cough and shortness of breath.   Cardiovascular:  Negative for chest pain.  Gastrointestinal:  Negative for abdominal pain, constipation, diarrhea, nausea and vomiting.  Genitourinary:  Negative for dysuria and urgency.  Musculoskeletal:  Negative for back pain and myalgias.  Neurological:  Negative for dizziness, weakness, light-headedness and headaches.  Psychiatric/Behavioral:  Negative for dysphoric mood. The patient is not  nervous/anxious.        Objective:        10/24/2023    2:09 PM 05/10/2023    3:41 PM 05/03/2023    9:49 AM  Vitals with BMI  Height 5' 4 5' 4 5' 4  Weight 215 lbs 237 lbs 231 lbs  BMI 36.89 40.66 39.63  Systolic  118 110  Diastolic  80 80  Pulse 106 110 58    No data found.   Physical Exam  Health Maintenance Due  Topic Date Due   OPHTHALMOLOGY EXAM  Never done   Pneumococcal Vaccine: 50+ Years (1 of 2 - PCV) Never done   Hepatitis B Vaccines 19-59 Average Risk (1 of 3 - 19+ 3-dose series) Never done   Zoster Vaccines- Shingrix (1 of 2) Never done   MAMMOGRAM  06/16/2023   Diabetic kidney evaluation - Urine ACR  09/07/2023   HEMOGLOBIN A1C  10/12/2023       Topic Date Due   Hepatitis B Vaccines 19-59 Average Risk (1 of 3 - 19+ 3-dose series) Never done     Lab Results  Component Value Date   TSH 1.820 04/14/2023   Lab Results  Component Value Date   WBC 7.9 04/14/2023   HGB 13.0 04/14/2023   HCT 40.3 04/14/2023   MCV 83 04/14/2023   PLT 291 04/14/2023   Lab Results  Component Value Date   NA 139 04/14/2023   K 5.1 04/14/2023   CO2 21 04/14/2023   GLUCOSE 167 (H) 04/14/2023   BUN 21 04/14/2023   CREATININE 0.95 04/14/2023   BILITOT 0.6 04/14/2023   ALKPHOS 119 04/14/2023   AST 32 04/14/2023   ALT 29 04/14/2023   PROT 6.5 04/14/2023   ALBUMIN 4.4 04/14/2023   CALCIUM  9.8 04/14/2023   EGFR 73 04/14/2023   Lab Results  Component Value Date   CHOL 169 04/14/2023   Lab Results  Component Value Date   HDL 41 04/14/2023   Lab Results  Component Value Date   LDLCALC 87 04/14/2023   Lab Results  Component Value Date   TRIG 244 (H) 04/14/2023   Lab Results  Component Value Date   CHOLHDL 4.1 04/14/2023  Lab Results  Component Value Date   HGBA1C 8.0 (H) 04/14/2023       Assessment & Plan:  There are no diagnoses linked to this encounter.   No orders of the defined types were placed in this encounter.   No orders of the  defined types were placed in this encounter.    Assessment and Plan Left proximal fibula fracture and left foot fractures Proximal fibula and nondisplaced toe fractures aligned well, no surgery needed. Significant pain and weight-bearing difficulty. Vicodin causing nausea and vomiting. Risk of posttraumatic arthritis. - Prescribed tramadol  for pain. - Prescribed oxycodone for nighttime if tramadol  insufficient. - Advised boot use for six weeks. - Permitted weight-bearing as tolerated, minimize pressure. - Advised ice and elevation 20-30 minutes, 3-5 times daily. - Discussed work accommodations for healing.  Left elbow contusion Significant bruising and tenderness, pain worsens with movement. - Recommended diclofenac gel up to four times daily.  Hypertension Well-controlled with medication. Elevated due to pain and stress from injury. - Rechecked blood pressure. - Refilled current medication.  Restless legs syndrome Affecting sleep, worsened by injury and pain management.  Recording duration: 12 minutes  Follow-up: No follow-ups on file.  An After Visit Summary was printed and given to the patient.  Abigail Free, MD [facility] [phone]

## 2023-10-25 DIAGNOSIS — S92902A Unspecified fracture of left foot, initial encounter for closed fracture: Secondary | ICD-10-CM

## 2023-10-25 DIAGNOSIS — S5002XA Contusion of left elbow, initial encounter: Secondary | ICD-10-CM | POA: Insufficient documentation

## 2023-10-25 DIAGNOSIS — S82832A Other fracture of upper and lower end of left fibula, initial encounter for closed fracture: Secondary | ICD-10-CM | POA: Insufficient documentation

## 2023-10-25 HISTORY — DX: Other fracture of upper and lower end of left fibula, initial encounter for closed fracture: S82.832A

## 2023-10-25 HISTORY — DX: Unspecified fracture of left foot, initial encounter for closed fracture: S92.902A

## 2023-10-25 HISTORY — DX: Contusion of left elbow, initial encounter: S50.02XA

## 2023-10-25 NOTE — Assessment & Plan Note (Signed)
 Significant bruising and tenderness, pain worsens with movement. - Recommended diclofenac gel up to four times daily.

## 2023-10-25 NOTE — Assessment & Plan Note (Signed)
 Proximal fibula and nondisplaced toe fractures aligned well, no surgery needed. Significant pain and weight-bearing difficulty. Vicodin causing nausea and vomiting. Risk of posttraumatic arthritis. - Prescribed tramadol  for pain. - Prescribed oxycodone for nighttime if tramadol  insufficient. - Advised boot use for six weeks. - Permitted weight-bearing as tolerated, minimize pressure. - Advised ice and elevation 20-30 minutes, 3-5 times daily. - Patient put off work until returns 11/14/2023.  - Keep follow up in 6 weeks with orthopedics.

## 2023-10-25 NOTE — Assessment & Plan Note (Signed)
 Affecting sleep, worsened by injury and pain management.

## 2023-10-25 NOTE — Assessment & Plan Note (Signed)
 Proximal fibula and nondisplaced toe fractures aligned well, no surgery needed. Significant pain and weight-bearing difficulty. Vicodin causing nausea and vomiting. Risk of posttraumatic arthritis. - Prescribed tramadol  for pain. - Prescribed oxycodone for nighttime if tramadol  insufficient. - Advised boot use for six weeks. - Permitted weight-bearing as tolerated, minimize pressure. - Advised ice and elevation 20-30 minutes, 3-5 times daily. - Discussed work accommodations for healing.

## 2023-10-25 NOTE — Assessment & Plan Note (Signed)
 Well-controlled with medication. Elevated due to pain and stress from injury. - Rechecked blood pressure. - Refilled current medication.

## 2023-10-31 ENCOUNTER — Other Ambulatory Visit: Payer: Self-pay | Admitting: Physician Assistant

## 2023-10-31 DIAGNOSIS — I10 Essential (primary) hypertension: Secondary | ICD-10-CM

## 2023-11-01 ENCOUNTER — Ambulatory Visit: Admitting: Physician Assistant

## 2023-11-01 ENCOUNTER — Other Ambulatory Visit: Payer: Self-pay | Admitting: Physician Assistant

## 2023-11-01 ENCOUNTER — Encounter: Payer: Self-pay | Admitting: Physician Assistant

## 2023-11-01 VITALS — BP 132/88 | HR 91 | Temp 98.0°F | Resp 18 | Ht 64.0 in | Wt 221.6 lb

## 2023-11-01 DIAGNOSIS — E1165 Type 2 diabetes mellitus with hyperglycemia: Secondary | ICD-10-CM

## 2023-11-01 DIAGNOSIS — S82832D Other fracture of upper and lower end of left fibula, subsequent encounter for closed fracture with routine healing: Secondary | ICD-10-CM

## 2023-11-01 DIAGNOSIS — N6489 Other specified disorders of breast: Secondary | ICD-10-CM

## 2023-11-01 DIAGNOSIS — K219 Gastro-esophageal reflux disease without esophagitis: Secondary | ICD-10-CM | POA: Diagnosis not present

## 2023-11-01 DIAGNOSIS — G2581 Restless legs syndrome: Secondary | ICD-10-CM | POA: Diagnosis not present

## 2023-11-01 DIAGNOSIS — R928 Other abnormal and inconclusive findings on diagnostic imaging of breast: Secondary | ICD-10-CM | POA: Insufficient documentation

## 2023-11-01 DIAGNOSIS — E559 Vitamin D deficiency, unspecified: Secondary | ICD-10-CM

## 2023-11-01 DIAGNOSIS — E782 Mixed hyperlipidemia: Secondary | ICD-10-CM | POA: Insufficient documentation

## 2023-11-01 DIAGNOSIS — I10 Essential (primary) hypertension: Secondary | ICD-10-CM

## 2023-11-01 DIAGNOSIS — G4733 Obstructive sleep apnea (adult) (pediatric): Secondary | ICD-10-CM | POA: Insufficient documentation

## 2023-11-01 DIAGNOSIS — R112 Nausea with vomiting, unspecified: Secondary | ICD-10-CM

## 2023-11-01 DIAGNOSIS — Z85528 Personal history of other malignant neoplasm of kidney: Secondary | ICD-10-CM

## 2023-11-01 HISTORY — DX: Vitamin D deficiency, unspecified: E55.9

## 2023-11-01 HISTORY — DX: Mixed hyperlipidemia: E78.2

## 2023-11-01 HISTORY — DX: Other abnormal and inconclusive findings on diagnostic imaging of breast: R92.8

## 2023-11-01 HISTORY — DX: Type 2 diabetes mellitus with hyperglycemia: E11.65

## 2023-11-01 HISTORY — DX: Obstructive sleep apnea (adult) (pediatric): G47.33

## 2023-11-01 MED ORDER — METOPROLOL TARTRATE 100 MG PO TABS
100.0000 mg | ORAL_TABLET | Freq: Two times a day (BID) | ORAL | 1 refills | Status: AC
Start: 2023-11-01 — End: ?

## 2023-11-01 MED ORDER — OLMESARTAN MEDOXOMIL 40 MG PO TABS: 40.0000 mg | ORAL_TABLET | Freq: Every day | ORAL | 1 refills | Status: AC

## 2023-11-01 MED ORDER — ONDANSETRON HCL 4 MG PO TABS: 4.0000 mg | ORAL_TABLET | Freq: Three times a day (TID) | ORAL | 1 refills | Status: DC | PRN

## 2023-11-01 NOTE — Progress Notes (Signed)
 Subjective:  Patient ID: Kelly Wilkins, female    DOB: [birth year]  Age: 48 y.o. MRN: [id number]  Chief Complaint  Patient presents with   Medical Management of Chronic Issues  Pt overdue for chronic follow up  Diabetes  Hypertension    Pt presents for follow up of hypertension.  The patient is tolerating the medication well without side effects. Compliance with treatment has been fair - missed last chronic visit -- states is taking meds as directed She is currently taking norvasc  10mg , lopressor  100mg  and benicar  20mg  qd - bp elevated today and was at last visit a few weeks ago with other provider as well- denies chest pain or dyspnea  Pt with GERD - stable on prilosec 40mg  qd however over the past 3 weeks since she had a leg injury she states she has not been able to eat or drink much and has had nausea lasting all day and occasional vomiting in the mornings and sometimes at night She denies any abdominal pain and no change in bowels She has had cholycystectomy She is taking zofran  but only once daily Pt actually gained 6 pounds since last visit on 10/24/23  Pt with NIDDM - her last hgb A1c was several months ago and was 8.0.  She is currently taking farxiga  10mg , glucophage  1000mg , and rybelsus  14mg  every day - she states she is taking meds as directed but not checking glucose at all She is overdue for eye appt  Pt with hyperlipidemia - currently on lipitor 20mg  every day- says she tries to watch diet   Pt with history of renal cancer in 2012.  She had been taking gabapentin  for pain after having kidney removed but states she is no longer taking.   She has been given referral and number to contact nephrologist for follow up since she has not seen specialist in years - when asked pt states she feels like she did possible go for appt but can't remember  Pt did have sleep study done and diagnosed with sleep apnea - is using CPAP machine now  A few weeks ago pt fell and broke her left  fibula and great toe.  She is currently in a boot and sees ortho in [place].  States her next appt with them is in October.  She was prescribed tramadol  for pain  Pt with Vit D def - taking weekly supplement -- is due for labwork  Pt is overdue for diagnostic mammogram that was due in 12/24 - she never went for appt that has been made for her Current Outpatient Medications on File Prior to Visit  Medication Sig Dispense Refill   albuterol  (VENTOLIN  HFA) 108 (90 Base) MCG/ACT inhaler INHALE 2 PUFFS BY MOUTH EVERY 6 HOURS AS NEEDED FOR WHEEZING FOR SHORTNESS OF BREATH 9 g 0   amLODipine  (NORVASC ) 10 MG tablet Take 1 tablet by mouth once daily 90 tablet 0   atorvastatin  (LIPITOR) 20 MG tablet Take 1 tablet (20 mg total) by mouth daily. 90 tablet 0   FARXIGA  10 MG TABS tablet Take 1 tablet by mouth once daily 90 tablet 0   metFORMIN  (GLUCOPHAGE ) 1000 MG tablet Take 1 tablet (1,000 mg total) by mouth 2 (two) times daily with a meal. 180 tablet 1   omeprazole  (PRILOSEC) 40 MG capsule Take 1 capsule (40 mg total) by mouth daily. 90 capsule 0   rOPINIRole  (REQUIP ) 2 MG tablet Take 1 tablet (2 mg total) by mouth at bedtime. 90 tablet 0  RYBELSUS  14 MG TABS Take 1 tablet by mouth once daily 90 tablet 0   traMADol  (ULTRAM ) 50 MG tablet 1-2 tablets three times a day as needed severe pain. 30 tablet 0   Vitamin D , Ergocalciferol , (DRISDOL ) 1.25 MG (50000 UNIT) CAPS capsule Take 1 capsule (50,000 Units total) by mouth every 7 (seven) days. 5 capsule 5   No current facility-administered medications on file prior to visit.   Past Medical History:  Diagnosis Date   Diabetes mellitus without complication (HCC)    Renal cancer [facility]    Past Surgical History:  Procedure Laterality Date   ABDOMINAL HYSTERECTOMY     BREAST BIOPSY Left 08/27/2022   MM LT BREAST BX W LOC DEV 1ST LESION IMAGE BX SPEC STEREO GUIDE 08/27/2022 [facility]   BREAST CYST ASPIRATION Bilateral    CHOLECYSTECTOMY      NEPHRECTOMY     TONSILLECTOMY      Family History  Problem Relation Age of Onset   Breast cancer Maternal Aunt    Hypertension Maternal Grandmother    Diabetes Maternal Grandmother    Hypertension Maternal Grandfather    Diabetes Maternal Grandfather    Hypertension Paternal Grandmother    Diabetes Paternal Grandmother    Hypertension Paternal Grandfather    Social History   Socioeconomic History   Marital status: Married    Spouse name: Not on file   Number of children: Not on file   Years of education: Not on file   Highest education level: Not on file  Occupational History   Not on file  Tobacco Use   Smoking status: Former    Types: Cigarettes   Smokeless tobacco: Not on file   Tobacco comments:    Stopped 12 years ago  Substance and Sexual Activity   Alcohol use: Yes    Comment: occasional   Drug use: No   Sexual activity: Not on file  Other Topics Concern   Not on file  Social History Narrative   Not on file   Social Drivers of Health   Financial Resource Strain: Low Risk  (09/07/2022)   Overall Financial Resource Strain (CARDIA)    Difficulty of Paying Living Expenses: Not hard at all  Food Insecurity: No Food Insecurity (10/24/2023)   Hunger Vital Sign    Worried About Running Out of Food in the Last Year: Never true    Ran Out of Food in the Last Year: Never true  Transportation Needs: No Transportation Needs (10/24/2023)   PRAPARE - Administrator, Civil Service (Medical): No    Lack of Transportation (Non-Medical): No  Physical Activity: Sufficiently Active (09/07/2022)   Exercise Vital Sign    Days of Exercise per Week: 6 days    Minutes of Exercise per Session: 30 min  Stress: No Stress Concern Present (09/07/2022)   [employer] of Occupational Health - Occupational Stress Questionnaire    Feeling of Stress : Not at all  Social Connections: Moderately Isolated (09/07/2022)   Social Connection and Isolation Panel    Frequency of  Communication with Friends and Family: More than three times a week    Frequency of Social Gatherings with Friends and Family: More than three times a week    Attends Religious Services: Never    Database administrator or Organizations: No    Attends Engineer, structural: Never    Marital Status: Married   CONSTITUTIONAL: see HPI E/N/T: Negative for ear pain, nasal congestion and sore  throat.  CARDIOVASCULAR: Negative for chest pain, dizziness, palpitations and pedal edema.  RESPIRATORY: Negative for recent cough and dyspnea.  GASTROINTESTINAL: see HPI MSK: see HPI INTEGUMENTARY: Negative for rash.  NEUROLOGICAL: Negative for dizziness and headaches.  PSYCHIATRIC: Negative for sleep disturbance and to question depression screen.  Negative for depression, negative for anhedonia.       Objective:  PHYSICAL EXAM:   VS: BP 132/88   Pulse 91   Temp 98 F (36.7 C) (Temporal)   Resp 18   Ht 5' 4 (1.626 m)   Wt 221 lb 9.6 oz (100.5 kg)   SpO2 99%   BMI 38.04 kg/m   GEN: Well nourished, well developed, in no acute distress   Cardiac: RRR; no murmurs, rubs, or gallops,no edema -  Respiratory:  normal respiratory rate and pattern with no distress - normal breath sounds with no rales, rhonchi, wheezes or rubs GI: normal bowel sounds, no masses or tenderness MS: left lower extremity in boot Skin: warm and dry, no rash  Neuro:  Alert and Oriented x 3,  - CN II-Xii grossly intact Psych: euthymic mood, appropriate affect and demeanor    Lab Results  Component Value Date   WBC 7.9 04/14/2023   HGB 13.0 04/14/2023   HCT 40.3 04/14/2023   PLT 291 04/14/2023   GLUCOSE 167 (H) 04/14/2023   CHOL 169 04/14/2023   TRIG 244 (H) 04/14/2023   HDL 41 04/14/2023   LDLCALC 87 04/14/2023   ALT 29 04/14/2023   AST 32 04/14/2023   NA 139 04/14/2023   K 5.1 04/14/2023   CL 100 04/14/2023   CREATININE 0.95 04/14/2023   BUN 21 04/14/2023   CO2 21 04/14/2023   TSH 1.820  04/14/2023   HGBA1C 8.0 (H) 04/14/2023      Assessment & Plan:   Problem List Items Addressed This Visit       Cardiovascular and Mediastinum   Benign hypertension - Primary   Relevant Orders   Comprehensive metabolic panel   Lipid panel Increase olmesartan  to 40mg  qd and continue other meds as directed   Other Visit Diagnoses     Mixed hyperlipidemia       Relevant Orders   Comprehensive metabolic panel   Lipid panel Watch diet Continue meds  Type 2 diabetes with hyperglycemia without use of insulin Continue current meds Watch diet Labwork pending  History of renal cancer Pt to let us  know if she actually went for appt and if not she needs to schedule- we will also reach out to that office ourselves to see if she went for appt  GERD Continue omeprazole  40mg  qd   Nausea and vomiting Take zofran  as directed - increase hydration Labwork pending  Obstructive sleep apnea Use CPAP as directed  Abnormal mammogram Bilateral diagnostic mammogram ordered  Restless leg syndrome Continue requip   Vit D def Continue supplement Labwork pending  Closed fracture distal end of left  fibula with routine healing     .  Meds ordered this encounter  Medications   ondansetron  (ZOFRAN ) 4 MG tablet    Sig: Take 1 tablet (4 mg total) by mouth every 8 (eight) hours as needed for nausea or vomiting.    Dispense:  30 tablet    Refill:  1    Supervising Provider:   COX, KIRSTEN [[id number]]   olmesartan  (BENICAR ) 40 MG tablet    Sig: Take 1 tablet (40 mg total) by mouth daily.    Dispense:  90 tablet  Refill:  1    Supervising Provider:   COX, KIRSTEN [[id number]]   metoprolol  tartrate (LOPRESSOR ) 100 MG tablet    Sig: Take 1 tablet (100 mg total) by mouth 2 (two) times daily.    Dispense:  180 tablet    Refill:  1    Supervising Provider:   SHERRE CLAPPER [phone]    Orders Placed This Encounter  Procedures   MM Digital Diagnostic Bilat   CBC with Differential/Platelet    Comprehensive metabolic panel with GFR   TSH   Lipid panel   Hemoglobin A1c   VITAMIN D  25 Hydroxy (Vit-D Deficiency, Fractures)   Amylase   Lipase     Follow-up: Return for as scheduled on 9/8 .  An After Visit Summary was printed and given to the patient.  [name] [facility] [phone]

## 2023-11-02 ENCOUNTER — Other Ambulatory Visit: Payer: Self-pay | Admitting: Physician Assistant

## 2023-11-02 ENCOUNTER — Encounter: Payer: Self-pay | Admitting: Physician Assistant

## 2023-11-02 ENCOUNTER — Ambulatory Visit: Payer: Self-pay | Admitting: Physician Assistant

## 2023-11-02 DIAGNOSIS — R899 Unspecified abnormal finding in specimens from other organs, systems and tissues: Secondary | ICD-10-CM

## 2023-11-02 DIAGNOSIS — K8500 Idiopathic acute pancreatitis without necrosis or infection: Secondary | ICD-10-CM

## 2023-11-02 DIAGNOSIS — R112 Nausea with vomiting, unspecified: Secondary | ICD-10-CM

## 2023-11-02 LAB — HEMOGLOBIN A1C
Est. average glucose Bld gHb Est-mCnc: 166 mg/dL
Hgb A1c MFr Bld: 7.4 % — ABNORMAL HIGH (ref 4.8–5.6)

## 2023-11-02 LAB — LIPID PANEL
Chol/HDL Ratio: 3.6 ratio (ref 0.0–4.4)
Cholesterol, Total: 136 mg/dL (ref 100–199)
HDL: 38 mg/dL — ABNORMAL LOW (ref >39)
LDL Chol Calc (NIH): 59 mg/dL (ref 0–99)
Triglycerides: 246 mg/dL — ABNORMAL HIGH (ref 0–149)
VLDL Cholesterol Cal: 39 mg/dL (ref 5–40)

## 2023-11-02 LAB — CBC WITH DIFFERENTIAL/PLATELET
Basophils Absolute: 0.1 x10E3/uL (ref 0.0–0.2)
Basos: 1 %
EOS (ABSOLUTE): 0.1 x10E3/uL (ref 0.0–0.4)
Eos: 1 %
Hematocrit: 46.3 % (ref 34.0–46.6)
Hemoglobin: 14.6 g/dL (ref 11.1–15.9)
Immature Grans (Abs): 0 x10E3/uL (ref 0.0–0.1)
Immature Granulocytes: 0 %
Lymphocytes Absolute: 3.1 x10E3/uL (ref 0.7–3.1)
Lymphs: 29 %
MCH: 25.5 pg — ABNORMAL LOW (ref 26.6–33.0)
MCHC: 31.5 g/dL (ref 31.5–35.7)
MCV: 81 fL (ref 79–97)
Monocytes Absolute: 0.7 x10E3/uL (ref 0.1–0.9)
Monocytes: 7 %
Neutrophils Absolute: 6.6 x10E3/uL (ref 1.4–7.0)
Neutrophils: 62 %
Platelets: 454 x10E3/uL — ABNORMAL HIGH (ref 150–450)
RBC: 5.72 x10E6/uL — ABNORMAL HIGH (ref 3.77–5.28)
RDW: 16.2 % — ABNORMAL HIGH (ref 11.7–15.4)
WBC: 10.6 x10E3/uL (ref 3.4–10.8)

## 2023-11-02 LAB — LIPASE: Lipase: 63 U/L (ref 14–72)

## 2023-11-02 LAB — COMPREHENSIVE METABOLIC PANEL WITH GFR
ALT: 48 IU/L — ABNORMAL HIGH (ref 0–32)
AST: 46 IU/L — ABNORMAL HIGH (ref 0–40)
Albumin: 4.7 g/dL (ref 3.9–4.9)
Alkaline Phosphatase: 123 IU/L — ABNORMAL HIGH (ref 44–121)
BUN/Creatinine Ratio: 11 (ref 9–23)
BUN: 13 mg/dL (ref 6–24)
Bilirubin Total: 0.7 mg/dL (ref 0.0–1.2)
CO2: 21 mmol/L (ref 20–29)
Calcium: 10.8 mg/dL — ABNORMAL HIGH (ref 8.7–10.2)
Chloride: 97 mmol/L (ref 96–106)
Creatinine, Ser: 1.21 mg/dL — ABNORMAL HIGH (ref 0.57–1.00)
Globulin, Total: 2.7 g/dL (ref 1.5–4.5)
Glucose: 198 mg/dL — ABNORMAL HIGH (ref 70–99)
Potassium: 5.6 mmol/L — ABNORMAL HIGH (ref 3.5–5.2)
Sodium: 136 mmol/L (ref 134–144)
Total Protein: 7.4 g/dL (ref 6.0–8.5)
eGFR: 55 mL/min/1.73 — ABNORMAL LOW (ref >59)

## 2023-11-02 LAB — VITAMIN D 25 HYDROXY (VIT D DEFICIENCY, FRACTURES): Vit D, 25-Hydroxy: 35.4 ng/mL (ref 30.0–100.0)

## 2023-11-02 LAB — AMYLASE: Amylase: 113 U/L — ABNORMAL HIGH (ref 31–110)

## 2023-11-02 LAB — TSH: TSH: 2.03 u[IU]/mL (ref 0.450–4.500)

## 2023-11-02 MED ORDER — PROMETHAZINE HCL 25 MG PO TABS: 25.0000 mg | ORAL_TABLET | Freq: Three times a day (TID) | ORAL | 0 refills | Status: AC | PRN

## 2023-11-03 ENCOUNTER — Ambulatory Visit (HOSPITAL_BASED_OUTPATIENT_CLINIC_OR_DEPARTMENT_OTHER)
Admission: RE | Admit: 2023-11-03 | Discharge: 2023-11-03 | Disposition: A | Discharge status: Home / Self Care | Source: Ambulatory Visit | Attending: Physician Assistant | Admitting: Physician Assistant

## 2023-11-03 ENCOUNTER — Encounter (HOSPITAL_BASED_OUTPATIENT_CLINIC_OR_DEPARTMENT_OTHER): Payer: Self-pay

## 2023-11-03 DIAGNOSIS — K8500 Idiopathic acute pancreatitis without necrosis or infection: Secondary | ICD-10-CM | POA: Insufficient documentation

## 2023-11-03 MED ORDER — IOHEXOL 300 MG/ML  SOLN
100.0000 mL | Freq: Once | INTRAMUSCULAR | Status: AC | PRN
  Administered 2023-11-03: 100 mL via INTRAVENOUS

## 2023-11-07 ENCOUNTER — Ambulatory Visit: Payer: Self-pay | Admitting: Physician Assistant

## 2023-11-07 ENCOUNTER — Other Ambulatory Visit: Payer: Self-pay | Admitting: Physician Assistant

## 2023-11-07 DIAGNOSIS — N2889 Other specified disorders of kidney and ureter: Secondary | ICD-10-CM

## 2023-11-09 ENCOUNTER — Encounter: Payer: Self-pay | Admitting: Physician Assistant

## 2023-11-09 ENCOUNTER — Ambulatory Visit: Admitting: Physician Assistant

## 2023-11-09 VITALS — BP 144/88 | HR 85 | Temp 98.2°F | Ht 64.0 in | Wt 219.6 lb

## 2023-11-09 DIAGNOSIS — R112 Nausea with vomiting, unspecified: Secondary | ICD-10-CM

## 2023-11-09 DIAGNOSIS — R5383 Other fatigue: Secondary | ICD-10-CM

## 2023-11-09 DIAGNOSIS — R1013 Epigastric pain: Secondary | ICD-10-CM | POA: Diagnosis not present

## 2023-11-09 NOTE — Progress Notes (Signed)
 Subjective:  Patient ID: Kelly Wilkins, female    DOB: 05/30/1975  Age: 48 y.o. MRN: [id number]  Chief Complaint  Patient presents with   Medical Management of Chronic Issues    HPI Pt in for follow up of nausea/vomiting She states that she is still staying nauseated most days and vomits each morning and occasionally at night.  She states zofran  was not helping with symptoms but phenergan  is helping.  She has had decreased appetite and lost 2 pounds since last week.  She is trying to stay hydrated She denies melena or hematochezia and bowel movements have been normal She denies urine or vaginal symptoms She denies fever but has had general malaise She is not having abdominal pain except some discomfort when she vomits.  She did have one episode of midepigastric pain the other day when bending over and felt a 'ball' in her stomach.  That symptom has resolved She denies reflux symptoms and takes prilosec 40mg  qd for hiatal hernia Pt had CT of abdomen/pelvis last week which showed no acute findings in abdomen but did showed new hypodensities on  left kidney - renal ultrasound has been ordered Labwork last week showed potassium and amylase slightly elevated (no pancreatitis on CT) and liver enzymes minimally elevated - will repeat labs today     11/09/2023    1:10 PM 11/01/2023   10:16 AM 05/03/2023    9:53 AM 04/14/2023    8:36 AM 11/10/2022    3:42 PM  Depression screen PHQ 2/9  Decreased Interest 0 0 0 0 0  Down, Depressed, Hopeless 0 0 0 0 0  PHQ - 2 Score 0 0 0 0 0  Altered sleeping    3 0  Tired, decreased energy    1 0  Change in appetite    1 0  Feeling bad or failure about yourself     0 0  Trouble concentrating    0 0  Moving slowly or fidgety/restless    0 0  Suicidal thoughts    0 0  PHQ-9 Score    5 0  Difficult doing work/chores    Not difficult at all         11/10/2022    3:42 PM 04/14/2023    8:36 AM 05/03/2023    9:53 AM 11/01/2023   10:16 AM 11/09/2023    1:10 PM   Fall Risk  Falls in the past year? 1 0 0 0 1  Was there an injury with Fall? 1 0 0 0 1  Fall Risk Category Calculator 2 0 0 0 2  Patient at Risk for Falls Due to  No Fall Risks No Fall Risks No Fall Risks History of fall(s)  Fall risk Follow up Falls evaluation completed Falls evaluation completed  Falls evaluation completed      ROS CONSTITUTIONAL: Negative for chills, fatigue, fever,  E/N/T: Negative for ear pain, nasal congestion and sore throat.  CARDIOVASCULAR: Negative for chest pain, dizziness, palpitations and pedal edema.  RESPIRATORY: Negative for recent cough and dyspnea.  GASTROINTESTINAL: see HPI MSK: Negative for arthralgias and myalgias.  INTEGUMENTARY: Negative for rash.  NEUROLOGICAL: Negative for dizziness and headaches.  PSYCHIATRIC: Negative for sleep disturbance and to question depression screen.  Negative for depression, negative for anhedonia.    Current Outpatient Medications:    albuterol  (VENTOLIN  HFA) 108 (90 Base) MCG/ACT inhaler, INHALE 2 PUFFS BY MOUTH EVERY 6 HOURS AS NEEDED FOR WHEEZING FOR SHORTNESS OF BREATH, Disp: 9 g,  Rfl: 0   amLODipine  (NORVASC ) 10 MG tablet, Take 1 tablet by mouth once daily, Disp: 90 tablet, Rfl: 0   atorvastatin  (LIPITOR) 20 MG tablet, Take 1 tablet (20 mg total) by mouth daily., Disp: 90 tablet, Rfl: 0   FARXIGA  10 MG TABS tablet, Take 1 tablet by mouth once daily, Disp: 90 tablet, Rfl: 0   metFORMIN  (GLUCOPHAGE ) 1000 MG tablet, Take 1 tablet (1,000 mg total) by mouth 2 (two) times daily with a meal., Disp: 180 tablet, Rfl: 1   metoprolol  tartrate (LOPRESSOR ) 100 MG tablet, Take 1 tablet (100 mg total) by mouth 2 (two) times daily., Disp: 180 tablet, Rfl: 1   olmesartan  (BENICAR ) 40 MG tablet, Take 1 tablet (40 mg total) by mouth daily., Disp: 90 tablet, Rfl: 1   omeprazole  (PRILOSEC) 40 MG capsule, Take 1 capsule (40 mg total) by mouth daily., Disp: 90 capsule, Rfl: 0   ondansetron  (ZOFRAN ) 4 MG tablet, Take 1 tablet (4 mg  total) by mouth every 8 (eight) hours as needed for nausea or vomiting., Disp: 30 tablet, Rfl: 1   promethazine  (PHENERGAN ) 25 MG tablet, Take 1 tablet (25 mg total) by mouth every 8 (eight) hours as needed for nausea or vomiting., Disp: 30 tablet, Rfl: 0   rOPINIRole  (REQUIP ) 2 MG tablet, Take 1 tablet (2 mg total) by mouth at bedtime., Disp: 90 tablet, Rfl: 0   RYBELSUS  14 MG TABS, Take 1 tablet by mouth once daily, Disp: 90 tablet, Rfl: 0   traMADol  (ULTRAM ) 50 MG tablet, 1-2 tablets three times a day as needed severe pain., Disp: 30 tablet, Rfl: 0   Vitamin D , Ergocalciferol , (DRISDOL ) 1.25 MG (50000 UNIT) CAPS capsule, Take 1 capsule (50,000 Units total) by mouth every 7 (seven) days., Disp: 5 capsule, Rfl: 5  Past Medical History:  Diagnosis Date   Diabetes mellitus without complication (HCC)    Renal cancer (HCC)    Objective:  PHYSICAL EXAM:   BP (!) 144/88   Pulse 85   Temp 98.2 F (36.8 C)   Ht 5' 4 (1.626 m)   Wt 219 lb 9.6 oz (99.6 kg)   SpO2 98%   BMI 37.69 kg/m    GEN: Well nourished, well developed, in no acute distress  Cardiac: RRR; no murmurs, rubs, Respiratory:  normal respiratory rate and pattern with no distress - normal breath sounds with no rales, rhonchi, wheezes or rubs GI: normal bowel sounds, no masses or tenderness Skin: warm and dry, no rash  Psych: euthymic mood, appropriate affect and demeanor  Assessment & Plan:    Nausea and vomiting, unspecified vomiting type -     Amylase -     Lipase -     Ambulatory referral to Gastroenterology Stop rybelsus  for now to see if helps with symptoms Other fatigue -     CBC with Differential/Platelet -     Comprehensive metabolic panel with GFR  Midepigastric pain -     Ambulatory referral to Gastroenterology     Follow-up: Return in about 4 weeks (around 12/07/2023) for follow-up.  An After Visit Summary was printed and given to the patient.  [facility] [phone]

## 2023-11-10 ENCOUNTER — Ambulatory Visit: Payer: Self-pay | Admitting: Physician Assistant

## 2023-11-10 LAB — CBC WITH DIFFERENTIAL/PLATELET
Basophils Absolute: 0.1 x10E3/uL (ref 0.0–0.2)
Basos: 1 %
EOS (ABSOLUTE): 0 x10E3/uL (ref 0.0–0.4)
Eos: 0 %
Hematocrit: 44.5 % (ref 34.0–46.6)
Hemoglobin: 14 g/dL (ref 11.1–15.9)
Immature Grans (Abs): 0 x10E3/uL (ref 0.0–0.1)
Immature Granulocytes: 0 %
Lymphocytes Absolute: 2.5 x10E3/uL (ref 0.7–3.1)
Lymphs: 27 %
MCH: 25.9 pg — ABNORMAL LOW (ref 26.6–33.0)
MCHC: 31.5 g/dL (ref 31.5–35.7)
MCV: 82 fL (ref 79–97)
Monocytes Absolute: 0.8 x10E3/uL (ref 0.1–0.9)
Monocytes: 8 %
Neutrophils Absolute: 6.1 x10E3/uL (ref 1.4–7.0)
Neutrophils: 64 %
Platelets: 373 x10E3/uL (ref 150–450)
RBC: 5.41 x10E6/uL — ABNORMAL HIGH (ref 3.77–5.28)
RDW: 16.7 % — ABNORMAL HIGH (ref 11.7–15.4)
WBC: 9.6 x10E3/uL (ref 3.4–10.8)

## 2023-11-10 LAB — COMPREHENSIVE METABOLIC PANEL WITH GFR
ALT: 51 IU/L — ABNORMAL HIGH (ref 0–32)
AST: 54 IU/L — ABNORMAL HIGH (ref 0–40)
Albumin: 4.8 g/dL (ref 3.9–4.9)
Alkaline Phosphatase: 106 IU/L (ref 44–121)
BUN/Creatinine Ratio: 13 (ref 9–23)
BUN: 14 mg/dL (ref 6–24)
Bilirubin Total: 0.7 mg/dL (ref 0.0–1.2)
CO2: 19 mmol/L — ABNORMAL LOW (ref 20–29)
Calcium: 10.4 mg/dL — ABNORMAL HIGH (ref 8.7–10.2)
Chloride: 101 mmol/L (ref 96–106)
Creatinine, Ser: 1.06 mg/dL — ABNORMAL HIGH (ref 0.57–1.00)
Globulin, Total: 2.4 g/dL (ref 1.5–4.5)
Glucose: 179 mg/dL — ABNORMAL HIGH (ref 70–99)
Potassium: 5 mmol/L (ref 3.5–5.2)
Sodium: 141 mmol/L (ref 134–144)
Total Protein: 7.2 g/dL (ref 6.0–8.5)
eGFR: 64 mL/min/1.73 (ref >59)

## 2023-11-10 LAB — AMYLASE: Amylase: 132 U/L — ABNORMAL HIGH (ref 31–110)

## 2023-11-10 LAB — LIPASE: Lipase: 88 U/L — ABNORMAL HIGH (ref 14–72)

## 2023-11-11 ENCOUNTER — Telehealth: Payer: Self-pay | Admitting: Physician Assistant

## 2023-11-11 ENCOUNTER — Ambulatory Visit (HOSPITAL_BASED_OUTPATIENT_CLINIC_OR_DEPARTMENT_OTHER)
Admission: RE | Admit: 2023-11-11 | Discharge: 2023-11-11 | Disposition: A | Discharge status: Home / Self Care | Source: Ambulatory Visit | Attending: Physician Assistant | Admitting: Physician Assistant

## 2023-11-11 ENCOUNTER — Encounter: Payer: Self-pay | Admitting: Family Medicine

## 2023-11-11 DIAGNOSIS — N2889 Other specified disorders of kidney and ureter: Secondary | ICD-10-CM | POA: Diagnosis not present

## 2023-11-11 NOTE — Telephone Encounter (Signed)
 [employer] Forms

## 2023-11-14 ENCOUNTER — Encounter: Payer: Self-pay | Admitting: Family Medicine

## 2023-11-14 ENCOUNTER — Encounter: Payer: Self-pay | Admitting: Physician Assistant

## 2023-11-14 ENCOUNTER — Ambulatory Visit: Admitting: Physician Assistant

## 2023-11-15 ENCOUNTER — Encounter: Payer: Self-pay | Admitting: Physician Assistant

## 2023-11-16 NOTE — Telephone Encounter (Signed)
 Done. Dr. Sherre

## 2023-11-21 ENCOUNTER — Other Ambulatory Visit: Payer: Self-pay | Admitting: Physician Assistant

## 2023-11-21 ENCOUNTER — Ambulatory Visit: Payer: Self-pay

## 2023-11-21 ENCOUNTER — Encounter: Payer: Self-pay | Admitting: Physician Assistant

## 2023-11-21 ENCOUNTER — Ambulatory Visit: Payer: Self-pay | Admitting: Physician Assistant

## 2023-11-21 DIAGNOSIS — N2889 Other specified disorders of kidney and ureter: Secondary | ICD-10-CM

## 2023-11-21 NOTE — Telephone Encounter (Signed)
 FYI Only or Action Required?: FYI only for provider.  Patient was last seen in primary care on 11/09/2023 by Nicholaus Credit, PA-C.  Called Nurse Triage reporting Back Pain.  Symptoms began several days ago.  Interventions attempted: Rest, hydration, or home remedies.  Symptoms are: rapidly worsening.  Triage Disposition: Go to ED Now (Notify PCP)  Patient/caregiver understands and will follow disposition?: Yes   Copied from CRM [id number]. Topic: Clinical - Red Word Triage >> Nov 21, 2023  4:01 PM Dedra B wrote: Red Word that prompted transfer to Nurse Triage: Pt is having back pain and nausea and said she can barely stand it. Warm transfer to nurse triage. Reason for Disposition  [1] SEVERE pain (e.g., excruciating, scale 8-10) AND [2] present > 1 hour  Answer Assessment - Initial Assessment Questions Additional info: Patient reports increased left sided flank pain today, she was able to go to work this morning then became nauseous and increasing pain, by the time she got to parking lot she was repeatedly vomiting. She has only eaten a banana today. Unable to hold anything down at this time. Left flank pain is intense now with difficulty ambulating. Patient will proceed to ER for evaluation.    1. LOCATION: Where does it hurt? (e.g., left, right)     Left sided 2. ONSET: When did the pain start?     Several days 3. SEVERITY: How bad is the pain? (e.g., Scale 1-10; mild, moderate, or severe)     Severe 4. PATTERN: Does the pain come and go, or is it constant?      Constant  5. CAUSE: What do you think is causing the pain?     Kidney stone 6. OTHER SYMPTOMS:  Do you have any other symptoms? (e.g., fever, abdomen pain, vomiting, leg weakness, burning with urination, blood in urine)     vomiting 7. PREGNANCY:  Is there any chance you are pregnant? When was your last menstrual period?  Protocols used: Flank Pain-A-AH

## 2023-11-22 ENCOUNTER — Other Ambulatory Visit: Payer: Self-pay | Admitting: Physician Assistant

## 2023-11-22 DIAGNOSIS — I10 Essential (primary) hypertension: Secondary | ICD-10-CM

## 2023-11-24 ENCOUNTER — Ambulatory Visit: Payer: Self-pay

## 2023-11-24 ENCOUNTER — Encounter: Payer: Self-pay | Admitting: Physician Assistant

## 2023-11-24 NOTE — Telephone Encounter (Signed)
 FYI Only or Action Required?: FYI only for provider.  Patient was last seen in primary care on 11/09/2023 by Nicholaus Credit, PA-C.  Called Nurse Triage reporting Advice Only.  Symptoms began today. Triage Disposition: Information or Advice Only Call  Patient/caregiver understands and will follow disposition?: Yes      Copied from CRM [id number]. Topic: Clinical - Red Word Triage >> Nov 24, 2023 12:43 PM Tiffini S wrote: [facility] that prompted transfer to Nurse Triage: Patient have rash on back- took a picture and sent to the pcp- was told Shingles scheduled appointment for 11/28/23. Reason for Disposition  Health information question, no triage required and triager able to answer question    R/s pt appt for 11/25/2023  Answer Assessment - Initial Assessment Questions 1. REASON FOR CALL: What is the main reason for your call? or How can I best help you? PAS transferred call to nurse to look for earlier appt to come in for rash.  Nurse scheduled appt for tomorrow with PCP and cancelled appt made by PAS.  Protocols used: Information Only Call - No Triage-A-AH

## 2023-11-25 ENCOUNTER — Ambulatory Visit: Admitting: Physician Assistant

## 2023-11-25 ENCOUNTER — Encounter: Payer: Self-pay | Admitting: Physician Assistant

## 2023-11-25 VITALS — BP 138/86 | HR 92 | Temp 97.5°F | Ht 64.0 in | Wt 225.2 lb

## 2023-11-25 DIAGNOSIS — B029 Zoster without complications: Secondary | ICD-10-CM

## 2023-11-25 HISTORY — DX: Zoster without complications: B02.9

## 2023-11-25 MED ORDER — VALACYCLOVIR HCL 1 G PO TABS: 1000.0000 mg | ORAL_TABLET | Freq: Three times a day (TID) | ORAL | 0 refills | Status: DC

## 2023-11-25 MED ORDER — PREDNISONE 20 MG PO TABS: ORAL_TABLET | ORAL | 0 refills | Status: DC

## 2023-11-25 NOTE — Progress Notes (Signed)
 Subjective:  Patient ID: Kelly Wilkins, female    DOB: 1975-10-03  Age: 48 y.o. MRN: [id number]  Chief Complaint  Patient presents with   Rash    HPI Pt in today for follow up after going to [facility] ED on 9/16.  She was having severe left flank pain and had workup done.  She has a history of prior right nephrectomy and recently had study that showed small nonobstructing kidney stone on left side.  She did have abdomen/pelvis CT done at ED with no acute abnormalities noted and left renal calculi was still nonobstructing.  She was discharged on pain medication and flomax Yesterday she noted rash on left flank in area of her pain and says pain radiates around to her stomach     11/25/2023   10:18 AM 11/09/2023    1:10 PM 11/01/2023   10:16 AM 05/03/2023    9:53 AM 04/14/2023    8:36 AM  Depression screen PHQ 2/9  Decreased Interest 0 0 0 0 0  Down, Depressed, Hopeless 0 0 0 0 0  PHQ - 2 Score 0 0 0 0 0  Altered sleeping     3  Tired, decreased energy     1  Change in appetite     1  Feeling bad or failure about yourself      0  Trouble concentrating     0  Moving slowly or fidgety/restless     0  Suicidal thoughts     0  PHQ-9 Score     5  Difficult doing work/chores     Not difficult at all        04/14/2023    8:36 AM 05/03/2023    9:53 AM 11/01/2023   10:16 AM 11/09/2023    1:10 PM 11/25/2023   10:18 AM  Fall Risk  Falls in the past year? 0 0 0 1 1  Was there an injury with Fall? 0 0 0 1 1  Fall Risk Category Calculator 0 0 0 2 2  Patient at Risk for Falls Due to No Fall Risks No Fall Risks No Fall Risks History of fall(s) History of fall(s)  Fall risk Follow up Falls evaluation completed  Falls evaluation completed  Falls evaluation completed     ROS CONSTITUTIONAL: Negative for chills, fatigue, fever,   CARDIOVASCULAR: Negative for chest pain, dizziness, palpitations and pedal edema.  RESPIRATORY: Negative for recent cough and dyspnea.  GASTROINTESTINAL: Negative for  abdominal pain, acid reflux symptoms, constipation, diarrhea, nausea and vomiting.  GU - no urine symptoms but has left flank pain Skin - see HPI   Current Outpatient Medications:    amLODipine  (NORVASC ) 10 MG tablet, Take 1 tablet by mouth once daily, Disp: 90 tablet, Rfl: 0   atorvastatin  (LIPITOR) 20 MG tablet, Take 1 tablet (20 mg total) by mouth daily., Disp: 90 tablet, Rfl: 0   FARXIGA  10 MG TABS tablet, Take 1 tablet by mouth once daily, Disp: 90 tablet, Rfl: 0   metFORMIN  (GLUCOPHAGE ) 1000 MG tablet, Take 1 tablet (1,000 mg total) by mouth 2 (two) times daily with a meal., Disp: 180 tablet, Rfl: 1   metoprolol  tartrate (LOPRESSOR ) 100 MG tablet, Take 1 tablet (100 mg total) by mouth 2 (two) times daily., Disp: 180 tablet, Rfl: 1   olmesartan  (BENICAR ) 40 MG tablet, Take 1 tablet (40 mg total) by mouth daily., Disp: 90 tablet, Rfl: 1   omeprazole  (PRILOSEC) 40 MG capsule, Take 1 capsule (40  mg total) by mouth daily., Disp: 90 capsule, Rfl: 0   ondansetron  (ZOFRAN -ODT) 4 MG disintegrating tablet, Take 4 mg by mouth every 8 (eight) hours as needed for nausea or vomiting., Disp: , Rfl:    promethazine  (PHENERGAN ) 25 MG tablet, Take 1 tablet (25 mg total) by mouth every 8 (eight) hours as needed for nausea or vomiting., Disp: 30 tablet, Rfl: 0   rOPINIRole  (REQUIP ) 2 MG tablet, Take 1 tablet (2 mg total) by mouth at bedtime., Disp: 90 tablet, Rfl: 0   tamsulosin (FLOMAX) 0.4 MG CAPS capsule, Take 0.4 mg by mouth daily after breakfast., Disp: , Rfl:    traMADol  (ULTRAM ) 50 MG tablet, 1-2 tablets three times a day as needed severe pain., Disp: 30 tablet, Rfl: 0   Vitamin D , Ergocalciferol , (DRISDOL ) 1.25 MG (50000 UNIT) CAPS capsule, Take 1 capsule (50,000 Units total) by mouth every 7 (seven) days., Disp: 5 capsule, Rfl: 5   oxyCODONE-acetaminophen (PERCOCET) 7.5-325 MG tablet, Take 1 tablet by mouth every 8 (eight) hours as needed., Disp: , Rfl:    predniSONE  (DELTASONE ) 20 MG tablet, 1 po tid  for 3 days then 1 po bid for 3 days then 1 po qd for 3 days, Disp: 18 tablet, Rfl: 0   valACYclovir  (VALTREX ) 1000 MG tablet, Take 1 tablet (1,000 mg total) by mouth 3 (three) times daily., Disp: 21 tablet, Rfl: 0  Past Medical History:  Diagnosis Date   Diabetes mellitus without complication (HCC)    Renal cancer (HCC)    Objective:  PHYSICAL EXAM:   BP 138/86   Pulse 92   Temp (!) 97.5 F (36.4 C)   Ht 5' 4 (1.626 m)   Wt 225 lb 3.2 oz (102.2 kg)   SpO2 98%   BMI 38.66 kg/m    GEN: Well nourished, well developed, in no acute distress  Cardiac: RRR; no murmurs, Respiratory:  normal respiratory rate and pattern with no distress - normal breath sounds with no rales, rhonchi, wheezes or rubs Skin: grouped vesicles on red base noted left flank Psych: euthymic mood, appropriate affect and demeanor  Assessment & Plan:    Herpes zoster without complication -     predniSONE ; 1 po tid for 3 days then 1 po bid for 3 days then 1 po qd for 3 days  Dispense: 18 tablet; Refill: 0 -     valACYclovir  HCl; Take 1 tablet (1,000 mg total) by mouth 3 (three) times daily.  Dispense: 21 tablet; Refill: 0     Follow-up: Return for as scheduled for next chronic visit.  An After Visit Summary was printed and given to the patient.  [name] [facility] [phone]

## 2023-11-28 ENCOUNTER — Encounter: Payer: Self-pay | Admitting: Physician Assistant

## 2023-11-28 ENCOUNTER — Inpatient Hospital Stay

## 2023-12-07 ENCOUNTER — Inpatient Hospital Stay (HOSPITAL_BASED_OUTPATIENT_CLINIC_OR_DEPARTMENT_OTHER)
Admission: RE | Admit: 2023-12-07 | Discharge: 2023-12-07 | Attending: Physician Assistant | Admitting: Physician Assistant

## 2023-12-07 DIAGNOSIS — N2889 Other specified disorders of kidney and ureter: Secondary | ICD-10-CM

## 2023-12-07 MED ORDER — GADOBUTROL 1 MMOL/ML IV SOLN
10.0000 mL | Freq: Once | INTRAVENOUS | Status: AC | PRN
  Administered 2023-12-07: 10 mL via INTRAVENOUS

## 2023-12-08 ENCOUNTER — Encounter: Payer: Self-pay | Admitting: Physician Assistant

## 2023-12-08 ENCOUNTER — Ambulatory Visit: Admitting: Physician Assistant

## 2023-12-08 ENCOUNTER — Ambulatory Visit: Payer: Self-pay | Admitting: Physician Assistant

## 2023-12-08 VITALS — BP 110/70 | HR 94 | Temp 97.8°F | Resp 18 | Ht 64.0 in | Wt 218.6 lb

## 2023-12-08 DIAGNOSIS — Q6102 Congenital multiple renal cysts: Secondary | ICD-10-CM

## 2023-12-08 DIAGNOSIS — R899 Unspecified abnormal finding in specimens from other organs, systems and tissues: Secondary | ICD-10-CM | POA: Diagnosis not present

## 2023-12-08 DIAGNOSIS — B029 Zoster without complications: Secondary | ICD-10-CM | POA: Diagnosis not present

## 2023-12-08 DIAGNOSIS — R112 Nausea with vomiting, unspecified: Secondary | ICD-10-CM | POA: Diagnosis not present

## 2023-12-08 NOTE — Progress Notes (Signed)
 Subjective:  Patient ID: Kelly Wilkins, female    DOB: May 14, 1975  Age: 48 y.o. MRN: [id number]  Chief Complaint  Patient presents with   Medical Management of Chronic Issues    HPI Pt in today for follow up - she is healing well from shingles - has completed valtrex  and prednisone   Pt is still having some nausea but not much vomiting.  She has lost more weight since last visit as well.  Is having some mild upper stomach pain on left side intermittently but has overall improved.  She had a CT scan a few weeks ago which showed no acute pancreatitis but her amylase and lipase were elevated. We have made GI referral but pt has not heard about appt --- info printed for pt so she can call to set up  Pt also has been referred to nephrology and she has not made appt - info printed for her so she can call to set up appt She had renal MRI yesterday and results are stable - has cysts noted on kidney  Pt had abnormal labwork at last visit - due to repeat cbc and iron  studies    11/25/2023   10:18 AM 11/09/2023    1:10 PM 11/01/2023   10:16 AM 05/03/2023    9:53 AM 04/14/2023    8:36 AM  Depression screen PHQ 2/9  Decreased Interest 0 0 0 0 0  Down, Depressed, Hopeless 0 0 0 0 0  PHQ - 2 Score 0 0 0 0 0  Altered sleeping     3  Tired, decreased energy     1  Change in appetite     1  Feeling bad or failure about yourself      0  Trouble concentrating     0  Moving slowly or fidgety/restless     0  Suicidal thoughts     0  PHQ-9 Score     5  Difficult doing work/chores     Not difficult at all        04/14/2023    8:36 AM 05/03/2023    9:53 AM 11/01/2023   10:16 AM 11/09/2023    1:10 PM 11/25/2023   10:18 AM  Fall Risk  Falls in the past year? 0 0 0 1 1  Was there an injury with Fall? 0 0 0 1 1  Fall Risk Category Calculator 0 0 0 2 2  Patient at Risk for Falls Due to No Fall Risks No Fall Risks No Fall Risks History of fall(s) History of fall(s)  Fall risk Follow up Falls evaluation  completed  Falls evaluation completed  Falls evaluation completed     ROS CONSTITUTIONAL: see HPI CARDIOVASCULAR: Negative for chest pain, dizziness, palpitations and pedal edema.  RESPIRATORY: Negative for recent cough and dyspnea.  GASTROINTESTINAL: see HPI INTEGUMENTARY: Negative for rash.  PSYCHIATRIC: Negative for sleep disturbance and to question depression screen.  Negative for depression, negative for anhedonia.    Current Outpatient Medications:    amLODipine  (NORVASC ) 10 MG tablet, Take 1 tablet by mouth once daily, Disp: 90 tablet, Rfl: 0   atorvastatin  (LIPITOR) 20 MG tablet, Take 1 tablet (20 mg total) by mouth daily., Disp: 90 tablet, Rfl: 0   FARXIGA  10 MG TABS tablet, Take 1 tablet by mouth once daily, Disp: 90 tablet, Rfl: 0   metFORMIN  (GLUCOPHAGE ) 1000 MG tablet, Take 1 tablet (1,000 mg total) by mouth 2 (two) times daily with a meal., Disp: 180 tablet,  Rfl: 1   metoprolol  tartrate (LOPRESSOR ) 100 MG tablet, Take 1 tablet (100 mg total) by mouth 2 (two) times daily., Disp: 180 tablet, Rfl: 1   olmesartan  (BENICAR ) 40 MG tablet, Take 1 tablet (40 mg total) by mouth daily., Disp: 90 tablet, Rfl: 1   omeprazole  (PRILOSEC) 40 MG capsule, Take 1 capsule (40 mg total) by mouth daily., Disp: 90 capsule, Rfl: 0   promethazine  (PHENERGAN ) 25 MG tablet, Take 1 tablet (25 mg total) by mouth every 8 (eight) hours as needed for nausea or vomiting., Disp: 30 tablet, Rfl: 0   rOPINIRole  (REQUIP ) 2 MG tablet, Take 1 tablet (2 mg total) by mouth at bedtime., Disp: 90 tablet, Rfl: 0   traMADol  (ULTRAM ) 50 MG tablet, 1-2 tablets three times a day as needed severe pain. (Patient taking differently: Take 50 mg by mouth as needed for moderate pain (pain score 4-6). 1-2 tablets three times a day as needed severe pain.), Disp: 30 tablet, Rfl: 0   Vitamin D , Ergocalciferol , (DRISDOL ) 1.25 MG (50000 UNIT) CAPS capsule, Take 1 capsule (50,000 Units total) by mouth every 7 (seven) days., Disp: 5  capsule, Rfl: 5  Past Medical History:  Diagnosis Date   Diabetes mellitus without complication (HCC)    Renal cancer (HCC)    Objective:  PHYSICAL EXAM:   BP 110/70   Pulse 94   Temp 97.8 F (36.6 C) (Temporal)   Resp 18   Ht 5' 4 (1.626 m)   Wt 218 lb 9.6 oz (99.2 kg)   SpO2 99%   BMI 37.52 kg/m    GEN: Well nourished, well developed, in no acute distress  Cardiac: RRR; no murmurs, rubs, or gallops,no edema - no significant varicosities Respiratory:  normal respiratory rate and pattern with no distress - normal breath sounds with no rales, rhonchi, wheezes or rubs GI: normal bowel sounds, no masses or tenderness Skin: warm and dry, no rash  Psych: euthymic mood, appropriate affect and demeanor  Assessment & Plan:    Abnormal laboratory test -     Fe+CBC/D/Plt+TIBC+Fer+Retic -     Lipase -     Amylase  Nausea and vomiting, unspecified vomiting type -     Lipase -     Amylase Call GI and schedule appt for consult Herpes zoster without complication- resolved   Multiple renal cysts Pt to call nephrology to set up appt  Follow-up: Return in about 2 months (around 02/07/2024) for chronic fasting follow-up.  An After Visit Summary was printed and given to the patient.  [name] [facility] [phone]

## 2023-12-09 ENCOUNTER — Ambulatory Visit: Payer: Self-pay | Admitting: Physician Assistant

## 2023-12-09 LAB — FE+CBC/D/PLT+TIBC+FER+RETIC
Basophils Absolute: 0.1 x10E3/uL (ref 0.0–0.2)
Basos: 1 %
EOS (ABSOLUTE): 0.1 x10E3/uL (ref 0.0–0.4)
Eos: 1 %
Ferritin: 22 ng/mL (ref 15–150)
Hematocrit: 42 % (ref 34.0–46.6)
Hemoglobin: 13.1 g/dL (ref 11.1–15.9)
Immature Grans (Abs): 0 x10E3/uL (ref 0.0–0.1)
Immature Granulocytes: 0 %
Iron Saturation: 12 % — ABNORMAL LOW (ref 15–55)
Iron: 44 ug/dL (ref 27–159)
Lymphocytes Absolute: 3.4 x10E3/uL — ABNORMAL HIGH (ref 0.7–3.1)
Lymphs: 29 %
MCH: 26.7 pg (ref 26.6–33.0)
MCHC: 31.2 g/dL — ABNORMAL LOW (ref 31.5–35.7)
MCV: 86 fL (ref 79–97)
Monocytes Absolute: 1.1 x10E3/uL — ABNORMAL HIGH (ref 0.1–0.9)
Monocytes: 9 %
Neutrophils Absolute: 7.2 x10E3/uL — ABNORMAL HIGH (ref 1.4–7.0)
Neutrophils: 60 %
Platelets: 372 x10E3/uL (ref 150–450)
RBC: 4.91 x10E6/uL (ref 3.77–5.28)
RDW: 16.9 % — ABNORMAL HIGH (ref 11.7–15.4)
Retic Ct Pct: 2.3 % (ref 0.6–2.6)
Total Iron Binding Capacity: 356 ug/dL (ref 250–450)
UIBC: 312 ug/dL (ref 131–425)
WBC: 11.9 x10E3/uL — ABNORMAL HIGH (ref 3.4–10.8)

## 2023-12-09 LAB — LIPASE: Lipase: 112 U/L — ABNORMAL HIGH (ref 14–72)

## 2023-12-09 LAB — AMYLASE: Amylase: 149 U/L — ABNORMAL HIGH (ref 31–110)

## 2023-12-14 ENCOUNTER — Ambulatory Visit: Payer: Self-pay

## 2023-12-14 ENCOUNTER — Other Ambulatory Visit: Payer: Self-pay | Admitting: Physician Assistant

## 2023-12-14 DIAGNOSIS — E1165 Type 2 diabetes mellitus with hyperglycemia: Secondary | ICD-10-CM

## 2023-12-14 NOTE — Telephone Encounter (Signed)
 FYI Only or Action Required?: FYI only for provider.  Patient was last seen in primary care on 12/08/2023 by Nicholaus Credit, PA-C.  Called Nurse Triage reporting Joint Swelling.  Symptoms began a week ago.  Interventions attempted: Nothing.  Symptoms are: stable.  Triage Disposition: See PCP When Office is Open (Within 3 Days)  Patient/caregiver understands and will follow disposition?: Yes Reason for Disposition  MODERATE ankle swelling (e.g., interferes with normal activities, can't move joint normally) (Exceptions: Itchy, localized swelling; swelling is chronic.)  Answer Assessment - Initial Assessment Questions Broke left foot recently, been out of boot for 2 weeks now. Patient only has one kidney, unsure if its a result of kidney issue or the broken foot. States swelling like this has not happened before.   1. LOCATION: Which ankle is swollen? Where is the swelling?     Left ankle  2. ONSET: When did the swelling start?     All week  3. SWELLING: How bad is the swelling? Or, How large is it? (e.g., mild, moderate, severe; size of localized swelling)      Whole ankle, almost looks like does not have ankle, shoes are tight  4. PAIN: Is there any pain? If Yes, ask: How bad is it? (Scale 0-10; or none, mild, moderate, severe)     Dull ache  5. CAUSE: What do you think caused the ankle swelling?     Unsure  6. OTHER SYMPTOMS: Do you have any other symptoms? (e.g., fever, chest pain, difficulty breathing, calf pain)     Denies  Protocols used: Ankle Swelling-A-AH  Copied from CRM [id number]. Topic: Clinical - Red Word Triage >> Dec 14, 2023  1:38 PM Larissa RAMAN wrote: [facility] that prompted transfer to Nurse Triage: Lt ankle swelling

## 2023-12-14 NOTE — Telephone Encounter (Signed)
 Called patient, she stated that swelling is just in the ankle that was broke, she denied any pain. Made patient aware provider recommends she call otho and make them aware of swelling. Patient will call office back if she doesn't need appointment for tomorrow.

## 2023-12-15 ENCOUNTER — Ambulatory Visit: Admitting: Physician Assistant

## 2023-12-16 ENCOUNTER — Ambulatory Visit (HOSPITAL_BASED_OUTPATIENT_CLINIC_OR_DEPARTMENT_OTHER): Admit: 2023-12-16 | Discharge: 2023-12-16 | Disposition: A | Discharge status: Home / Self Care | Admitting: Radiology

## 2023-12-16 ENCOUNTER — Ambulatory Visit (HOSPITAL_BASED_OUTPATIENT_CLINIC_OR_DEPARTMENT_OTHER)
Admission: EM | Admit: 2023-12-16 | Discharge: 2023-12-16 | Disposition: A | Discharge status: Home / Self Care | Attending: Family Medicine | Admitting: Family Medicine

## 2023-12-16 ENCOUNTER — Encounter (HOSPITAL_BASED_OUTPATIENT_CLINIC_OR_DEPARTMENT_OTHER): Payer: Self-pay

## 2023-12-16 ENCOUNTER — Other Ambulatory Visit: Payer: Self-pay | Admitting: Physician Assistant

## 2023-12-16 DIAGNOSIS — M25475 Effusion, left foot: Secondary | ICD-10-CM

## 2023-12-16 DIAGNOSIS — M7989 Other specified soft tissue disorders: Secondary | ICD-10-CM

## 2023-12-16 DIAGNOSIS — E1165 Type 2 diabetes mellitus with hyperglycemia: Secondary | ICD-10-CM

## 2023-12-16 NOTE — ED Triage Notes (Signed)
 Patient states just returned to work on September 24 following a left fibula fx and toe fx. Had been using a boot and when she returned to work, she stopped wearing the boot. Patient states increased swelling over last week. States dull ache to leg/foot. Has been unable to bend 3 toes on left foot since injury. Significant swelling of left foot/ankle.

## 2023-12-16 NOTE — Discharge Instructions (Signed)
 No specific concerns  on your x-ray today.  I would recommend resting, icing and elevate the leg is much as possible through the weekend.  You may want to think about going back to wearing the boot.  Please follow-up with orthopedic for further issues.

## 2023-12-16 NOTE — ED Provider Notes (Signed)
 [facility]    CSN: [id number] Arrival date & time: 12/16/23  1452      History   Chief Complaint Chief Complaint  Patient presents with   Joint Swelling    HPI Kelly Wilkins is a 48 y.o. female.   Pt is a 48 year old female that presents with left ankle/foot pain and swelling. Patient states just returned to work on September 24 following a left fibula fx and toe fx. Had been using a boot and when she returned to work, she stopped wearing the boot. Patient states increased swelling over last week. States dull ache to leg/foot. Has been unable to bend 3 toes on left foot since injury. Significant swelling of left foot/ankle.      Past Medical History:  Diagnosis Date   Diabetes mellitus without complication (HCC)    Renal cancer [facility])     Patient Active Problem List   Diagnosis Date Noted   Herpes zoster without complication 11/25/2023   OSA (obstructive sleep apnea) 11/01/2023   Abnormal mammogram 11/01/2023   Vitamin D  deficiency 11/01/2023   Type 2 diabetes mellitus with hyperglycemia, without long-term current use of insulin (HCC) 11/01/2023   Mixed hyperlipidemia 11/01/2023   Multiple closed fractures of left foot 10/25/2023   Closed fracture of upper end of left fibula 10/25/2023   Contusion of left elbow 10/25/2023   Left foot pain 10/19/2023   Diabetic peripheral neuropathy associated with type 2 diabetes mellitus (HCC) 08/21/2023   Malignant neoplasm of kidney (HCC) 08/21/2023   Hypertension 08/21/2023   Acute upper respiratory infection 05/10/2023   Influenza A 05/10/2023   Nausea and vomiting 05/03/2023   Upper respiratory tract infection due to COVID-19 virus 12/02/2022   Acute sore throat 12/02/2022   Fever 11/26/2022   Iron  deficiency anemia due to chronic blood loss 01/23/2021   Benign hypertension 12/02/2020   Chronic right hip pain 12/02/2020   Restless leg syndrome 12/02/2020   Gastroesophageal reflux disease without esophagitis  12/02/2020   History of kidney cancer 12/02/2020   Colon cancer screening 12/02/2020   Plantar fasciitis of right foot 02/20/2018   Sebaceous cyst 07/01/2016    Past Surgical History:  Procedure Laterality Date   ABDOMINAL HYSTERECTOMY     BREAST BIOPSY Left 08/27/2022   MM LT BREAST BX W LOC DEV 1ST LESION IMAGE BX SPEC STEREO GUIDE 08/27/2022 [facility]   BREAST CYST ASPIRATION Bilateral    CHOLECYSTECTOMY     NEPHRECTOMY     TONSILLECTOMY      OB History   No obstetric history on file.      Home Medications    Prior to Admission medications   Medication Sig Start Date End Date Taking? Authorizing Provider  amLODipine  (NORVASC ) 10 MG tablet Take 1 tablet by mouth once daily 11/22/23   Nicholaus Credit, PA-C  atorvastatin  (LIPITOR) 20 MG tablet Take 1 tablet (20 mg total) by mouth daily. 05/23/23   Nicholaus Credit, PA-C  FARXIGA  10 MG TABS tablet Take 1 tablet by mouth once daily 12/16/23   Nicholaus Credit, PA-C  metFORMIN  (GLUCOPHAGE ) 1000 MG tablet Take 1 tablet (1,000 mg total) by mouth 2 (two) times daily with a meal. 08/22/23   Nicholaus Credit, PA-C  metoprolol  tartrate (LOPRESSOR ) 100 MG tablet Take 1 tablet (100 mg total) by mouth 2 (two) times daily. 11/01/23   Nicholaus Credit, PA-C  olmesartan  (BENICAR ) 40 MG tablet Take 1 tablet (40 mg total) by mouth daily. 11/01/23   Nicholaus Credit,  PA-C  omeprazole  (PRILOSEC) 40 MG capsule Take 1 capsule (40 mg total) by mouth daily. 10/24/23   CoxAbigail, MD  promethazine  (PHENERGAN ) 25 MG tablet Take 1 tablet (25 mg total) by mouth every 8 (eight) hours as needed for nausea or vomiting. 11/02/23   Nicholaus Credit, PA-C  rOPINIRole  (REQUIP ) 2 MG tablet Take 1 tablet (2 mg total) by mouth at bedtime. 09/12/23   Nicholaus Credit, PA-C  traMADol  (ULTRAM ) 50 MG tablet 1-2 tablets three times a day as needed severe pain. Patient taking differently: Take 50 mg by mouth as needed for moderate pain (pain score 4-6). 1-2 tablets three times a day as needed severe pain.  10/24/23   Cox, Abigail, MD  Vitamin D , Ergocalciferol , (DRISDOL ) 1.25 MG (50000 UNIT) CAPS capsule Take 1 capsule (50,000 Units total) by mouth every 7 (seven) days. 04/15/23   Nicholaus Credit, PA-C    Family History Family History  Problem Relation Age of Onset   Breast cancer Maternal Aunt    Hypertension Maternal Grandmother    Diabetes Maternal Grandmother    Hypertension Maternal Grandfather    Diabetes Maternal Grandfather    Hypertension Paternal Grandmother    Diabetes Paternal Grandmother    Hypertension Paternal Grandfather     Social History Social History   Tobacco Use   Smoking status: Former    Types: Cigarettes   Tobacco comments:    Stopped 12 years ago  Substance Use Topics   Alcohol use: Yes    Comment: occasional   Drug use: No     Allergies   Ibuprofen, Lisinopril, Codeine, Penicillins, and Sulfa antibiotics   Review of Systems Review of Systems  See HPI Physical Exam Triage Vital Signs ED Triage Vitals  Encounter Vitals Group     BP 12/16/23 1502 138/81     Girls Systolic BP Percentile --      Girls Diastolic BP Percentile --      Boys Systolic BP Percentile --      Boys Diastolic BP Percentile --      Pulse Rate 12/16/23 1502 78     Resp 12/16/23 1502 20     Temp 12/16/23 1502 98 F (36.7 C)     Temp Source 12/16/23 1502 Oral     SpO2 12/16/23 1502 97 %     Weight --      Height --      Head Circumference --      Peak Flow --      Pain Score 12/16/23 1505 6     Pain Loc --      Pain Education --      Exclude from Growth Chart --    No data found.  Updated Vital Signs BP 138/81 (BP Location: Right Arm)   Pulse 78   Temp 98 F (36.7 C) (Oral)   Resp 20   SpO2 97%   Visual Acuity Right Eye Distance:   Left Eye Distance:   Bilateral Distance:    Right Eye Near:   Left Eye Near:    Bilateral Near:     Physical Exam Vitals and nursing note reviewed.  Constitutional:      General: She is not in acute distress.     Appearance: Normal appearance. She is not ill-appearing, toxic-appearing or diaphoretic.  Pulmonary:     Effort: Pulmonary effort is normal.  Musculoskeletal:        General: Swelling present.     Left ankle: Swelling present. Decreased range  of motion.     Comments: Significant swelling to left foot and ankle.  2+ pedal pulse.  Decreased ROM   Neurological:     Mental Status: She is alert.  Psychiatric:        Mood and Affect: Mood normal.      UC Treatments / Results  Labs (all labs ordered are listed, but only abnormal results are displayed) Labs Reviewed - No data to display  EKG   Radiology DG Foot Complete Left Result Date: 12/16/2023 CLINICAL DATA:  Left foot swelling. Recently returned to work after a left fibular and left toe fracture. Patient stopped wearing the boot and has increased swelling over the last week. EXAM: LEFT FOOT - COMPLETE 3+ VIEW; LEFT ANKLE COMPLETE - 3+ VIEW COMPARISON:  Left foot 11/10/2022 FINDINGS: Three views of the left ankle and three views of the left foot are obtained. Left ankle: There is diffuse soft tissue swelling about the left ankle. No acute fracture or dislocation is identified. Joint spaces are normal. The distal left fibula appears intact. Left foot: There is a new ununited ossicle at the medial aspect of the base of the proximal phalanx of the left first toe consistent with ununited fracture. Fracture line extends to the articular surface. No displacement of the fracture fragments. No additional fractures are identified. Small plantar calcaneal spur. IMPRESSION: 1. Diffuse soft tissue swelling about the left ankle. No acute displaced fractures identified. 2. Ununited fracture fragment at the base of the proximal phalanx left first toe. This is new since 2024. Electronically Signed   By: Elsie Gravely M.D.   On: 12/16/2023 15:46   DG Ankle Complete Left Result Date: 12/16/2023 CLINICAL DATA:  Left foot swelling. Recently returned to  work after a left fibular and left toe fracture. Patient stopped wearing the boot and has increased swelling over the last week. EXAM: LEFT FOOT - COMPLETE 3+ VIEW; LEFT ANKLE COMPLETE - 3+ VIEW COMPARISON:  Left foot 11/10/2022 FINDINGS: Three views of the left ankle and three views of the left foot are obtained. Left ankle: There is diffuse soft tissue swelling about the left ankle. No acute fracture or dislocation is identified. Joint spaces are normal. The distal left fibula appears intact. Left foot: There is a new ununited ossicle at the medial aspect of the base of the proximal phalanx of the left first toe consistent with ununited fracture. Fracture line extends to the articular surface. No displacement of the fracture fragments. No additional fractures are identified. Small plantar calcaneal spur. IMPRESSION: 1. Diffuse soft tissue swelling about the left ankle. No acute displaced fractures identified. 2. Ununited fracture fragment at the base of the proximal phalanx left first toe. This is new since 2024. Electronically Signed   By: Elsie Gravely M.D.   On: 12/16/2023 15:46    Procedures Procedures (including critical care time)  Medications Ordered in UC Medications - No data to display  Initial Impression / Assessment and Plan / UC Course  I have reviewed the triage vital signs and the nursing notes.  Pertinent labs & imaging results that were available during my care of the patient were reviewed by me and considered in my medical decision making (see chart for details).     Foot swelling- No specific concerns on xrays today .  Diffuse soft tissue swelling about the left ankle with no acute fractures.  There is a ununited fracture fragment at the base of the proximal phalanx of the left first toe. I  am unsure why there is swelling today.  My concern would be some sort of ligament issue or she came out of the boot too early and the injury was not fully healed. She is on her feet a lot  at work.   Recommended to rest, ice, elevate the foot.  I would recommend going back into the boot for support and following up with the orthopedic next week.  Patient understanding and agree. Final Clinical Impressions(s) / UC Diagnoses   Final diagnoses:  Foot swelling     Discharge Instructions      No specific concerns  on your x-ray today.  I would recommend resting, icing and elevate the leg is much as possible through the weekend.  You may want to think about going back to wearing the boot.  Please follow-up with orthopedic for further issues.    ED Prescriptions   None    PDMP not reviewed this encounter.   Adah Wilbert LABOR, FNP 12/17/23 [phone]

## 2023-12-17 ENCOUNTER — Other Ambulatory Visit: Payer: Self-pay | Admitting: Physician Assistant

## 2023-12-17 DIAGNOSIS — G2581 Restless legs syndrome: Secondary | ICD-10-CM

## 2023-12-21 ENCOUNTER — Ambulatory Visit: Payer: Self-pay

## 2023-12-21 NOTE — Telephone Encounter (Signed)
 FYI Only or Action Required?: FYI only for provider.  Patient was last seen in primary care on 12/08/2023 by Nicholaus Credit, PA-C.  Called Nurse Triage reporting Joint Swelling.  Symptoms began several weeks ago.  Interventions attempted: Rest, hydration, or home remedies.  Symptoms are: gradually worsening.  Triage Disposition: See PCP When Office is Open (Within 3 Days)  Patient/caregiver understands and will follow disposition?: Yes Reason for Disposition  MODERATE ankle swelling (e.g., interferes with normal activities, can't move joint normally) (Exceptions: Itchy, localized swelling; swelling is chronic.)  Answer Assessment - Initial Assessment Questions Patient called ortho and cannot get in until end of October beginning of November, patient already scheduled for 10/28 at ortho. Went to UC on 10/10 and was told it wasn't broken and thinks its a tendon or ligament issue. Patient requesting referral, explained referrals take a while to authorize and by that time she'll likely already have her appointment with ortho on 10/28. Patient adamant on being seen by PCP as soon as possible, stated  I can't keep doing this.    1. LOCATION: Which ankle is swollen? Where is the swelling?     Left ankle, down into foot  2. ONSET: When did the swelling start?     A few weeks  3. SWELLING: How bad is the swelling? Or, How large is it? (e.g., mild, moderate, severe; size of localized swelling)      Swells up like a balloon  4. PAIN: Is there any pain? If Yes, ask: How bad is it? (Scale 0-10; or none, mild, moderate, severe)     7/10  5. CAUSE: What do you think caused the ankle swelling?     Broke foot and fibula last month  6. OTHER SYMPTOMS: Do you have any other symptoms? (e.g., fever, chest pain, difficulty breathing, calf pain)      Denies  Protocols used: Ankle Swelling-A-AH  Copied from CRM #[id number]. Topic: Clinical - Red Word Triage >> Dec 21, 2023 10:58 AM  Tinnie BROCKS wrote: Red Word that prompted transfer to Nurse Triage: Swollen ankle, previously triaged on 10/8. She was told to see her foot dr but they had no availability so she went to urgent care. They said she might have problem with ligament or tendon. Needs to be seen soon or get referral to a specialist.

## 2023-12-22 ENCOUNTER — Ambulatory Visit (INDEPENDENT_AMBULATORY_CARE_PROVIDER_SITE_OTHER): Admitting: Physician Assistant

## 2023-12-22 ENCOUNTER — Encounter: Payer: Self-pay | Admitting: Physician Assistant

## 2023-12-22 VITALS — BP 132/82 | HR 75 | Temp 97.8°F | Ht 64.0 in | Wt 226.0 lb

## 2023-12-22 DIAGNOSIS — R600 Localized edema: Secondary | ICD-10-CM

## 2023-12-22 DIAGNOSIS — M79672 Pain in left foot: Secondary | ICD-10-CM

## 2023-12-22 DIAGNOSIS — Z905 Acquired absence of kidney: Secondary | ICD-10-CM | POA: Diagnosis not present

## 2023-12-22 DIAGNOSIS — I10 Essential (primary) hypertension: Secondary | ICD-10-CM

## 2023-12-22 DIAGNOSIS — K219 Gastro-esophageal reflux disease without esophagitis: Secondary | ICD-10-CM | POA: Diagnosis not present

## 2023-12-22 DIAGNOSIS — S92401K Displaced unspecified fracture of right great toe, subsequent encounter for fracture with nonunion: Secondary | ICD-10-CM

## 2023-12-22 MED ORDER — GABAPENTIN 300 MG PO CAPS: 300.0000 mg | ORAL_CAPSULE | Freq: Two times a day (BID) | ORAL | 3 refills | Status: DC

## 2023-12-22 MED ORDER — HYDROCHLOROTHIAZIDE 25 MG PO TABS: ORAL_TABLET | ORAL | 0 refills | Status: DC

## 2023-12-22 NOTE — Assessment & Plan Note (Addendum)
 Persistent pain and swelling likely due to nerve inflammation, possibly starting of Morton's neuroma. No fractures on x-ray. Previous fibula and toe fracture history with possible tendon involvement. - Order MRI of right foot to assess tendons and nerve involvement. - Prescribe hydrochlorothiazide for one week to reduce swelling, with informed consent regarding increased urination and blood pressure monitoring. - Instruct on use of compression socks or Ace Wrap for swelling management, with demonstration provided. - Consider gabapentin  for nerve pain if diuretic and compression are insufficient. - Advise to elevate foot above heart level when possible. - Instruct to monitor blood pressure and adjust amlodipine  if necessary. Orders:   MR FOOT LEFT WO CONTRAST; Future

## 2023-12-22 NOTE — Progress Notes (Signed)
 Acute Office Visit  Subjective:    Patient ID: Kelly Wilkins, female    DOB: [birth year], 48 y.o.   MRN: [id number]  Chief Complaint  Patient presents with   Left foot Swelling    HPI: Patient is in today for continued foot pain and swelling  Discussed the use of AI scribe software for clinical note transcription with the patient, who gave verbal consent to proceed.  History of Present Illness Kelly Wilkins is a 48 year old female who presents with continuing ankle pain and swelling.  She has been experiencing persistent swelling and severe pain in her ankle since returning to work on September 24th after recovering from a broken fibula and toe. The pain is described as akin to having something heavy dropped on her foot and is accompanied by 'pins and needles' sensations. 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  She notes an inability to bend her last two toes, describing them as feeling tight. Although she has not had an MRI, an x-ray at urgent care showed no fractures. Swelling in her foot is increasing daily, becoming particularly painful after her hour-long drive home, often causing her to cry due to the intensity of the pain.  Her past medical history includes a broken fibula and toe from a fall down a handicap ramp and a history of kidney cancer, resulting in the removal of her left kidney. She manages her condition by elevating her foot and resting as much as possible after work. She has not been using compression socks.  She is currently on blood pressure medication and has previously used gabapentin  for nerve pain following her kidney surgery. The foot pain is affecting her mood and interactions at work, making her 'grumpy' due to the constant discomfort.    Past Medical History:  Diagnosis Date   Diabetes mellitus without complication (HCC)    Renal cancer [facility])     Past Surgical History:  Procedure Laterality Date    ABDOMINAL HYSTERECTOMY     BREAST BIOPSY Left 08/27/2022   MM LT BREAST BX W LOC DEV 1ST LESION IMAGE BX SPEC STEREO GUIDE 08/27/2022 [facility]   BREAST CYST ASPIRATION Bilateral    CHOLECYSTECTOMY     NEPHRECTOMY     TONSILLECTOMY      Family History  Problem Relation Age of Onset   Breast cancer Maternal Aunt    Hypertension Maternal Grandmother    Diabetes Maternal Grandmother    Hypertension Maternal Grandfather    Diabetes Maternal Grandfather    Hypertension Paternal Grandmother    Diabetes Paternal Grandmother    Hypertension Paternal Grandfather     Social History   Socioeconomic History   Marital status: Married    Spouse name: Not on file   Number of children: Not on file   Years of education: Not on file   Highest education level: Not on file  Occupational History   Not on file  Tobacco Use   Smoking status: Former    Types: Cigarettes   Smokeless tobacco: Not on file   Tobacco comments:    Stopped 12 years ago  Substance and Sexual Activity   Alcohol use: Yes    Comment: occasional   Drug use: No   Sexual activity: Not on file  Other Topics Concern   Not on file  Social History Narrative   Not on file   Social Drivers of Health   Financial Resource Strain: Low  Risk  (09/07/2022)   Overall Financial Resource Strain (CARDIA)    Difficulty of Paying Living Expenses: Not hard at all  Food Insecurity: No Food Insecurity (10/24/2023)   Hunger Vital Sign    Worried About Running Out of Food in the Last Year: Never true    Ran Out of Food in the Last Year: Never true  Transportation Needs: No Transportation Needs (10/24/2023)   PRAPARE - Administrator, Civil Service (Medical): No    Lack of Transportation (Non-Medical): No  Physical Activity: Sufficiently Active (09/07/2022)   Exercise Vital Sign    Days of Exercise per Week: 6 days    Minutes of Exercise per Session: 30 min  Stress: No Stress Concern Present (09/07/2022)   [employer] - Occupational Stress Questionnaire    Feeling of Stress : Not at all  Social Connections: Moderately Isolated (09/07/2022)   Social Connection and Isolation Panel    Frequency of Communication with Friends and Family: More than three times a week    Frequency of Social Gatherings with Friends and Family: More than three times a week    Attends Religious Services: Never    Database administrator or Organizations: No    Attends Banker Meetings: Never    Marital Status: Married  Catering manager Violence: Not At Risk (10/24/2023)   Humiliation, Afraid, Rape, and Kick questionnaire    Fear of Current or Ex-Partner: No    Emotionally Abused: No    Physically Abused: No    Sexually Abused: No    Outpatient Medications Prior to Visit  Medication Sig Dispense Refill   amLODipine  (NORVASC ) 10 MG tablet Take 1 tablet by mouth once daily 90 tablet 0   atorvastatin  (LIPITOR) 20 MG tablet Take 1 tablet (20 mg total) by mouth daily. 90 tablet 0   FARXIGA  10 MG TABS tablet Take 1 tablet by mouth once daily 90 tablet 0   metFORMIN  (GLUCOPHAGE ) 1000 MG tablet Take 1 tablet (1,000 mg total) by mouth 2 (two) times daily with a meal. 180 tablet 1   metoprolol  tartrate (LOPRESSOR ) 100 MG tablet Take 1 tablet (100 mg total) by mouth 2 (two) times daily. 180 tablet 1   olmesartan  (BENICAR ) 40 MG tablet Take 1 tablet (40 mg total) by mouth daily. 90 tablet 1   omeprazole  (PRILOSEC) 40 MG capsule Take 1 capsule (40 mg total) by mouth daily. 90 capsule 0   promethazine  (PHENERGAN ) 25 MG tablet Take 1 tablet (25 mg total) by mouth every 8 (eight) hours as needed for nausea or vomiting. 30 tablet 0   rOPINIRole  (REQUIP ) 2 MG tablet TAKE 1 TABLET BY MOUTH AT BEDTIME 90 tablet 0   traMADol  (ULTRAM ) 50 MG tablet 1-2 tablets three times a day as needed severe pain. (Patient taking differently: Take 50 mg by mouth as needed for moderate pain (pain score 4-6). 1-2 tablets  three times a day as needed severe pain.) 30 tablet 0   Vitamin D , Ergocalciferol , (DRISDOL ) 1.25 MG (50000 UNIT) CAPS capsule Take 1 capsule (50,000 Units total) by mouth every 7 (seven) days. 5 capsule 5   No facility-administered medications prior to visit.    Allergies  Allergen Reactions   Ibuprofen Other (See Comments) and Nausea And Vomiting    Due to history of kidney cancer Other reaction(s): Other (See Comments) Due to history of kidney cancer Other reaction(s): Other (See Comments) Other reaction(s): Other (See Comments) Due to history  of kidney cancer Due to history of kidney cancer Due to history of kidney cancer Other reaction(s): Other (See Comments) Due to history of kidney cancer   Lisinopril Cough   Codeine Rash   Penicillins Nausea And Vomiting and Rash   Sulfa Antibiotics Nausea And Vomiting and Rash    Review of Systems  Constitutional:  Negative for appetite change, fatigue and fever.  HENT:  Negative for congestion, ear pain, sinus pressure and sore throat.   Respiratory:  Negative for cough, chest tightness, shortness of breath and wheezing.   Cardiovascular:  Negative for chest pain and palpitations.  Gastrointestinal:  Negative for abdominal pain, constipation, diarrhea, nausea and vomiting.  Genitourinary:  Negative for dysuria and hematuria.  Musculoskeletal:  Positive for myalgias (Left foot/ankle pain). Negative for arthralgias, back pain and joint swelling.  Skin:  Negative for rash.  Neurological:  Negative for dizziness, weakness and headaches.  Psychiatric/Behavioral:  Negative for dysphoric mood. The patient is not nervous/anxious.        Objective:        12/22/2023    3:30 PM 12/16/2023    3:02 PM 12/08/2023    2:47 PM  Vitals with BMI  Height 5' 4  5' 4  Weight 226 lbs  218 lbs 10 oz  BMI 38.77  37.5  Systolic 132 138 889  Diastolic 82 81 70  Pulse 75 78 94    Orthostatic VS for the past 72 hrs (Last 3 readings):  Patient  Position BP Location  12/22/23 1530 Sitting Left Arm     Physical Exam Vitals reviewed.  Constitutional:      Appearance: Normal appearance.  Cardiovascular:     Rate and Rhythm: Normal rate and regular rhythm.     Heart sounds: Normal heart sounds.  Pulmonary:     Effort: Pulmonary effort is normal.     Breath sounds: Normal breath sounds.  Abdominal:     General: Bowel sounds are normal.     Palpations: Abdomen is soft.     Tenderness: There is no abdominal tenderness.  Musculoskeletal:     Right ankle: Normal.     Left ankle: Swelling present. Tenderness present. Normal range of motion. Normal pulse.     Left foot: Decreased range of motion. Swelling and tenderness present.  Neurological:     Mental Status: She is alert and oriented to person, place, and time.  Psychiatric:        Mood and Affect: Mood normal.        Behavior: Behavior normal.     Health Maintenance Due  Topic Date Due   OPHTHALMOLOGY EXAM  Never done   Diabetic kidney evaluation - Urine ACR  09/07/2023    There are no preventive care reminders to display for this patient.   Lab Results  Component Value Date   TSH 2.030 11/01/2023   Lab Results  Component Value Date   WBC 11.9 (H) 12/08/2023   HGB 13.1 12/08/2023   HCT 42.0 12/08/2023   MCV 86 12/08/2023   PLT 372 12/08/2023   Lab Results  Component Value Date   NA 141 11/09/2023   K 5.0 11/09/2023   CO2 19 (L) 11/09/2023   GLUCOSE 179 (H) 11/09/2023   BUN 14 11/09/2023   CREATININE 1.06 (H) 11/09/2023   BILITOT 0.7 11/09/2023   ALKPHOS 106 11/09/2023   AST 54 (H) 11/09/2023   ALT 51 (H) 11/09/2023   PROT 7.2 11/09/2023   ALBUMIN 4.8 11/09/2023  CALCIUM  10.4 (H) 11/09/2023   EGFR 64 11/09/2023   Lab Results  Component Value Date   CHOL 136 11/01/2023   Lab Results  Component Value Date   HDL 38 (L) 11/01/2023   Lab Results  Component Value Date   LDLCALC 59 11/01/2023   Lab Results  Component Value Date   TRIG 246  (H) 11/01/2023   Lab Results  Component Value Date   CHOLHDL 3.6 11/01/2023   Lab Results  Component Value Date   HGBA1C 7.4 (H) 11/01/2023        Results for orders placed or performed in visit on 12/08/23  Fe+CBC/D/Plt+TIBC+Fer+Retic   Collection Time: 12/08/23  3:22 PM  Result Value Ref Range   Total Iron  Binding Capacity 356 250 - 450 ug/dL   UIBC 687 868 - 574 ug/dL   Iron  44 27 - 159 ug/dL   Iron  Saturation 12 (L) 15 - 55 %   Ferritin 22 15 - 150 ng/mL   WBC 11.9 (H) 3.4 - 10.8 x10E3/uL   RBC 4.91 3.77 - 5.28 x10E6/uL   Hemoglobin 13.1 11.1 - 15.9 g/dL   Hematocrit 57.9 65.9 - 46.6 %   MCV 86 79 - 97 fL   MCH 26.7 26.6 - 33.0 pg   MCHC 31.2 (L) 31.5 - 35.7 g/dL   RDW 83.0 (H) 88.2 - 84.5 %   Platelets 372 150 - 450 x10E3/uL   Neutrophils 60 Not Estab. %   Lymphs 29 Not Estab. %   Monocytes 9 Not Estab. %   Eos 1 Not Estab. %   Basos 1 Not Estab. %   Neutrophils Absolute 7.2 (H) 1.4 - 7.0 x10E3/uL   Lymphocytes Absolute 3.4 (H) 0.7 - 3.1 x10E3/uL   Monocytes Absolute 1.1 (H) 0.1 - 0.9 x10E3/uL   EOS (ABSOLUTE) 0.1 0.0 - 0.4 x10E3/uL   Basophils Absolute 0.1 0.0 - 0.2 x10E3/uL   Immature Granulocytes 0 Not Estab. %   Immature Grans (Abs) 0.0 0.0 - 0.1 x10E3/uL   Retic Ct Pct 2.3 0.6 - 2.6 %  Lipase   Collection Time: 12/08/23  3:22 PM  Result Value Ref Range   Lipase 112 (H) 14 - 72 U/L  Amylase   Collection Time: 12/08/23  3:22 PM  Result Value Ref Range   Amylase 149 (H) 31 - 110 U/L     Assessment & Plan:   Assessment & Plan Edema of left foot Persistent pain and swelling likely due to nerve inflammation, possibly starting of Morton's neuroma. No fractures on x-ray. Previous fibula and toe fracture history with possible tendon involvement. - Order MRI of right foot to assess tendons and nerve involvement. - Prescribe hydrochlorothiazide for one week to reduce swelling, with informed consent regarding increased urination and blood pressure  monitoring. - Instruct on use of compression socks or Ace Wrap for swelling management, with demonstration provided. - Consider gabapentin  for nerve pain if diuretic and compression are insufficient. - Advise to elevate foot above heart level when possible. - Instruct to monitor blood pressure and adjust amlodipine  if necessary. Orders:   hydrochlorothiazide (HYDRODIURIL) 25 MG tablet; Take one tablet daily for 1 week. Then discontinue.   gabapentin  (NEURONTIN ) 300 MG capsule; Take 1 capsule (300 mg total) by mouth 2 (two) times daily.   MR FOOT LEFT WO CONTRAST; Future  Left foot pain Persistent pain and swelling likely due to nerve inflammation, possibly starting of Morton's neuroma. No fractures on x-ray. Previous fibula and toe fracture history with  possible tendon involvement. - Order MRI of right foot to assess tendons and nerve involvement. - Prescribe hydrochlorothiazide for one week to reduce swelling, with informed consent regarding increased urination and blood pressure monitoring. - Instruct on use of compression socks or Ace Wrap for swelling management, with demonstration provided. - Consider gabapentin  for nerve pain if diuretic and compression are insufficient. - Advise to elevate foot above heart level when possible. - Instruct to monitor blood pressure and adjust amlodipine  if necessary. Orders:   MR FOOT LEFT WO CONTRAST; Future  Gastroesophageal reflux disease without esophagitis Recurrent vomiting with ongoing gastrointestinal symptoms. Awaiting gastroenterology consultation. NSAIDs avoided due to symptoms and single kidney status. - Await gastroenterology consultation. - Avoid NSAIDs due to gastrointestinal symptoms.    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 Kidney function stable. Long-term diuretic use avoided to protect kidney function. - Avoid long-term use of diuretics.    Benign hypertension Hypertension management complicated by  single kidney status. Amlodipine  may contribute to swelling. Short-term diuretic use considered with close monitoring. - Monitor blood pressure closely, especially with diuretic use. - Adjust amlodipine  dosing if blood pressure drops significantly.    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 Pain primarily from swelling and nerve involvement, not fracture site. - Advise against high-impact activities. - Use Ace Wrap for support and swelling management.     Body mass index is 38.79 kg/m..  Meds ordered this encounter  Medications   hydrochlorothiazide (HYDRODIURIL) 25 MG tablet    Sig: Take one tablet daily for 1 week. Then discontinue.    Dispense:  30 tablet    Refill:  0   gabapentin  (NEURONTIN ) 300 MG capsule    Sig: Take 1 capsule (300 mg total) by mouth 2 (two) times daily.    Dispense:  60 capsule    Refill:  3    Orders Placed This Encounter  Procedures   MR FOOT LEFT WO CONTRAST     Follow-up: Return if symptoms worsen or fail to improve.  An After Visit Summary was printed and given to the patient.   I,Marla I Leal-Borjas,acting as a scribe for [employer], PA.,have documented all relevant documentation on the behalf of Nola Angles, PA,as directed by  Nola Angles, PA while in the presence of Nola Angles, [place].   I,Lauren M Auman,acting as a scribe for Nola Angles, PA.,have documented all relevant documentation on the behalf of Nola Angles, PA,as directed by  Nola Angles, PA while in the presence of Nola Angles, [place].    Nola Angles, [place] [facility] [phone]

## 2023-12-22 NOTE — Assessment & Plan Note (Signed)
 Hypertension management complicated by single kidney status. Amlodipine  may contribute to swelling. Short-term diuretic use considered with close monitoring. - Monitor blood pressure closely, especially with diuretic use. - Adjust amlodipine  dosing if blood pressure drops significantly.

## 2023-12-22 NOTE — Assessment & Plan Note (Signed)
 Recurrent vomiting with ongoing gastrointestinal symptoms. Awaiting gastroenterology consultation. NSAIDs avoided due to symptoms and single kidney status. - Await gastroenterology consultation. - Avoid NSAIDs due to gastrointestinal symptoms.

## 2023-12-29 ENCOUNTER — Ambulatory Visit (INDEPENDENT_AMBULATORY_CARE_PROVIDER_SITE_OTHER)
Admission: RE | Admit: 2023-12-29 | Discharge: 2023-12-29 | Disposition: A | Discharge status: Home / Self Care | Source: Ambulatory Visit | Attending: Physician Assistant | Admitting: Physician Assistant

## 2023-12-29 DIAGNOSIS — R600 Localized edema: Secondary | ICD-10-CM | POA: Diagnosis not present

## 2023-12-29 DIAGNOSIS — M79672 Pain in left foot: Secondary | ICD-10-CM

## 2024-01-04 ENCOUNTER — Ambulatory Visit: Payer: Self-pay | Admitting: Physician Assistant

## 2024-01-11 ENCOUNTER — Other Ambulatory Visit: Payer: Self-pay | Admitting: Physician Assistant

## 2024-01-11 DIAGNOSIS — E1165 Type 2 diabetes mellitus with hyperglycemia: Secondary | ICD-10-CM

## 2024-01-11 NOTE — Telephone Encounter (Signed)
 Please call pt and see if she has seen GI yet --- would wait restarting rybelsus  until she has consulted with them about her current GI issues

## 2024-01-17 ENCOUNTER — Other Ambulatory Visit: Payer: Self-pay | Admitting: Family Medicine

## 2024-01-17 DIAGNOSIS — K219 Gastro-esophageal reflux disease without esophagitis: Secondary | ICD-10-CM

## 2024-01-27 ENCOUNTER — Encounter: Payer: Self-pay | Admitting: Physician Assistant

## 2024-01-27 ENCOUNTER — Other Ambulatory Visit (INDEPENDENT_AMBULATORY_CARE_PROVIDER_SITE_OTHER)

## 2024-01-27 ENCOUNTER — Ambulatory Visit: Admitting: Physician Assistant

## 2024-01-27 VITALS — BP 178/116 | HR 85 | Ht 64.0 in | Wt 219.0 lb

## 2024-01-27 DIAGNOSIS — R197 Diarrhea, unspecified: Secondary | ICD-10-CM | POA: Diagnosis not present

## 2024-01-27 DIAGNOSIS — D509 Iron deficiency anemia, unspecified: Secondary | ICD-10-CM

## 2024-01-27 DIAGNOSIS — R030 Elevated blood-pressure reading, without diagnosis of hypertension: Secondary | ICD-10-CM | POA: Diagnosis not present

## 2024-01-27 DIAGNOSIS — Z905 Acquired absence of kidney: Secondary | ICD-10-CM

## 2024-01-27 DIAGNOSIS — G4733 Obstructive sleep apnea (adult) (pediatric): Secondary | ICD-10-CM

## 2024-01-27 DIAGNOSIS — E1142 Type 2 diabetes mellitus with diabetic polyneuropathy: Secondary | ICD-10-CM

## 2024-01-27 DIAGNOSIS — K219 Gastro-esophageal reflux disease without esophagitis: Secondary | ICD-10-CM

## 2024-01-27 DIAGNOSIS — R112 Nausea with vomiting, unspecified: Secondary | ICD-10-CM

## 2024-01-27 DIAGNOSIS — E1165 Type 2 diabetes mellitus with hyperglycemia: Secondary | ICD-10-CM

## 2024-01-27 LAB — CBC WITH DIFFERENTIAL/PLATELET
Basophils Absolute: 0.1 K/uL (ref 0.0–0.1)
Basophils Relative: 1 % (ref 0.0–3.0)
Eosinophils Absolute: 0.1 K/uL (ref 0.0–0.7)
Eosinophils Relative: 1.1 % (ref 0.0–5.0)
HCT: 39.6 % (ref 36.0–46.0)
Hemoglobin: 12.8 g/dL (ref 12.0–15.0)
Lymphocytes Relative: 38.7 % (ref 12.0–46.0)
Lymphs Abs: 3.2 K/uL (ref 0.7–4.0)
MCHC: 32.4 g/dL (ref 30.0–36.0)
MCV: 80.9 fl (ref 78.0–100.0)
Monocytes Absolute: 0.8 K/uL (ref 0.1–1.0)
Monocytes Relative: 9.9 % (ref 3.0–12.0)
Neutro Abs: 4.1 K/uL (ref 1.4–7.7)
Neutrophils Relative %: 49.3 % (ref 43.0–77.0)
Platelets: 337 K/uL (ref 150.0–400.0)
RBC: 4.89 Mil/uL (ref 3.87–5.11)
RDW: 16.4 % — ABNORMAL HIGH (ref 11.5–15.5)
WBC: 8.3 K/uL (ref 4.0–10.5)

## 2024-01-27 LAB — IBC + FERRITIN
Ferritin: 7.5 ng/mL — ABNORMAL LOW (ref 10.0–291.0)
Iron: 19 ug/dL — ABNORMAL LOW (ref 42–145)
Saturation Ratios: 4.1 % — ABNORMAL LOW (ref 20.0–50.0)
TIBC: 466.2 ug/dL — ABNORMAL HIGH (ref 250.0–450.0)
Transferrin: 333 mg/dL (ref 212.0–360.0)

## 2024-01-27 LAB — VITAMIN B12: Vitamin B-12: 328 pg/mL (ref 211–911)

## 2024-01-27 LAB — COMPREHENSIVE METABOLIC PANEL WITH GFR
ALT: 26 U/L (ref 0–35)
AST: 24 U/L (ref 0–37)
Albumin: 4.4 g/dL (ref 3.5–5.2)
Alkaline Phosphatase: 98 U/L (ref 39–117)
BUN: 13 mg/dL (ref 6–23)
CO2: 26 meq/L (ref 19–32)
Calcium: 9.2 mg/dL (ref 8.4–10.5)
Chloride: 104 meq/L (ref 96–112)
Creatinine, Ser: 0.89 mg/dL (ref 0.40–1.20)
GFR: 75.37 mL/min (ref >60.00)
Glucose, Bld: 178 mg/dL — ABNORMAL HIGH (ref 70–99)
Potassium: 4.1 meq/L (ref 3.5–5.1)
Sodium: 143 meq/L (ref 135–145)
Total Bilirubin: 0.4 mg/dL (ref 0.2–1.2)
Total Protein: 7.2 g/dL (ref 6.0–8.3)

## 2024-01-27 LAB — LIPASE: Lipase: 61 U/L — ABNORMAL HIGH (ref 11.0–59.0)

## 2024-01-27 LAB — AMYLASE: Amylase: 93 U/L (ref 27–131)

## 2024-01-27 LAB — SEDIMENTATION RATE: Sed Rate: 20 mm/h (ref 0–30)

## 2024-01-27 MED ORDER — METOCLOPRAMIDE HCL 5 MG PO TABS: 5.0000 mg | ORAL_TABLET | Freq: Three times a day (TID) | ORAL | 0 refills | Status: AC | PRN

## 2024-01-27 MED ORDER — SUCRALFATE 1 G PO TABS: 1.0000 g | ORAL_TABLET | Freq: Three times a day (TID) | ORAL | 0 refills | Status: DC

## 2024-01-27 NOTE — Progress Notes (Signed)
 01/27/2024 Kelly Wilkins [id number] Jul 15, 1975  Referring provider: Nicholaus Credit, PA-C Primary GI doctor: Dr. 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 Current medications include metoprolol , olmesartan , and amlodipine . Olmesartan  may contribute to gastrointestinal symptoms.Has Headache, dizziness, nausea and vomiting.  - Advised ER or urgent care for immediate blood pressure management. - Recommended follow-up with primary care for hypertension management. - Suggested considering switching olmesartan  due to potential enteropathy. - schedule EGD/colon for IDA/symptom AFTER blood pressure is improved and evaluated  IDA S/p total hysterectomy 12/08/2023  HGB 13.1 MCV 86 Platelets 372 12/08/2023 Iron  44 Ferritin 22  Recent Labs    04/14/23 0908 11/01/23 1142 11/09/23 1341 12/08/23 1522  HGB 13.0 14.6 14.0 13.1  Reheck iron , ferritin, B12 Get EGD colonoscopy WHEN her blood pressure is better controlled, sent to ER for evaluation with headache, dizziness and history of nephrectomy Close follow up 4 weeks.  ER precautions discussed  Nausea and vomiting intermittently x3-4 months  every day, can have right nose bleed, food vomitus, no blood Post prandial AB pain with nausea/vomiting, worse with solid foods, chest pain with esophageal spasm Has lost about 60 lbs over 4-5 months Has had mildly elevated lipase/amylase 2018 EGD at [facility] unremarkable other than [facility] 08/21/2023 CTAP W fatty infiltration of liver no splenomegaly or ascites, s/p cholecystectomy  ? Gastroparesis, GERD, HTN, renal, pancreatitis - Provided gastroparesis diet information. - Prescribed metoclopramide  for nausea and gastroparesis. - Prescribed Carafate  for potential bile reflux. - Ordered labs for celiac disease and lipase recheck. - Recommended ER or urgent care for brain scan to rule out brain  bleed. - Advised follow-up with primary care for blood pressure management.  Diarrhea  can look oily, 2 x a day, denies hematochezia S/p cholecystectomy  08/21/2023 CTAP W post nephrectomy changes on the right nonobstructing left renal calculus fatty infiltration of the liver -Consider switching olmestartan, ? Olmestartan enteropathy  -Possible bile acid diarrhea, consider colestipol - check celiac, ESR, CRP  Fatty liver    Latest Ref Rng & Units 11/09/2023    1:41 PM 11/01/2023   11:42 AM 04/14/2023    9:08 AM  Hepatic Function  Total Protein 6.0 - 8.5 g/dL 7.2  7.4  6.5   Albumin 3.9 - 4.9 g/dL 4.8  4.7  4.4   AST 0 - 40 IU/L 54  46  32   ALT 0 - 32 IU/L 51  48  29   Alk Phosphatase 44 - 121 IU/L 106  123  119   Total Bilirubin 0.0 - 1.2 mg/dL 0.7  0.7  0.6    Platelets 372  08/21/2023 CTAP W fatty infiltration of liver no splenomegaly or ascites Recheck labs If elevated will get hepatocellular work up  Personal history of tubular adenomatous polyps 04/2016 tubular adenomatous polyp ascending colon Needs recall colonoscopy  Renal carcinoma S/p right nephrectomy 2012  Patient Care Team: Nicholaus Credit, DEVONNA as PCP - General (Physician Assistant)  HISTORY OF PRESENT ILLNESS: 48 y.o. female with a past medical history listed below presents for evaluation of nausea, vomiting, diarrhea, found to have elevated blood pressure.   Previously seen by Dr. Roetta at [facility] gastroenterology 2018 for right upper quadrant abdominal pain.  Discussed the use of AI scribe software for clinical note transcription with the patient, who gave verbal consent to proceed.  History of Present Illness   Kelly Wilkins is a 48 year  old female with hypertension who presents with persistent nausea and vomiting.  She has been experiencing persistent nausea and vomiting for the past three to four months, occurring almost daily and worsening after meals. Vomiting occurs with nearly everything she eats, though  no blood is observed. She experiences epistaxis during these episodes. She has been taking Phenergan  and Zofran  to manage these symptoms, but they are not always effective. Softer foods like Greek yogurt and soup are better tolerated.  She experiences abdominal pain that radiates through her body, particularly after eating, and describes it as severe. She has a history of acid reflux and is currently on heartburn medication. She also reports a sensation similar to angina, with pain radiating upwards, and notes that it occurs after food or drink.  She has a history of high blood pressure, currently managed with Benicar , metoprolol , and amlodipine . Despite taking her medications, her blood pressure remains elevated, and she suspects she may have vomited her pills. She has experienced high blood pressure in the past but notes it has been a long time since it was this high.  She reports a significant weight loss of approximately 60 pounds over the past four months, which she attributes to her vomiting. She also experiences diarrhea, describing it as loose and oily, occurring twice daily, often with meals. This change in bowel habits is recent, as she previously had bowel movements once a week.  She experiences dizziness, particularly when bending over, and has a persistent headache, though not the worst she has ever had. She also reports facial puffiness and occasional visual changes requiring the use of reading glasses.  She has a history of kidney cancer, resulting in the removal of her right kidney in 2012. She also underwent a complete hysterectomy in 2013 or 2014. She is chronically iron  deficient and has received iron  infusions at a cancer center.      She  reports that she has quit smoking. Her smoking use included cigarettes. She does not have any smokeless tobacco history on file. She reports current alcohol use. She reports that she does not use drugs.  RELEVANT GI HISTORY, IMAGING AND  LABS: Results   LABS Lipase: 112 (12/27/2023) Amylase: 149 (12/27/2023)      CBC    Component Value Date/Time   WBC 11.9 (H) 12/08/2023 1522   WBC 8.2 05/04/2012 0813   RBC 4.91 12/08/2023 1522   RBC 4.81 01/23/2021 0000   HGB 13.1 12/08/2023 1522   HCT 42.0 12/08/2023 1522   PLT 372 12/08/2023 1522   MCV 86 12/08/2023 1522   MCH 26.7 12/08/2023 1522   MCH 27.4 05/04/2012 0813   MCHC 31.2 (L) 12/08/2023 1522   MCHC 34.2 05/04/2012 0813   RDW 16.9 (H) 12/08/2023 1522   LYMPHSABS 3.4 (H) 12/08/2023 1522   MONOABS 0.7 05/04/2012 0813   EOSABS 0.1 12/08/2023 1522   BASOSABS 0.1 12/08/2023 1522   Recent Labs    04/14/23 0908 11/01/23 1142 11/09/23 1341 12/08/23 1522  HGB 13.0 14.6 14.0 13.1    CMP     Component Value Date/Time   NA 141 11/09/2023 1341   K 5.0 11/09/2023 1341   CL 101 11/09/2023 1341   CO2 19 (L) 11/09/2023 1341   GLUCOSE 179 (H) 11/09/2023 1341   GLUCOSE 116 (H) 05/04/2012 0813   BUN 14 11/09/2023 1341   CREATININE 1.06 (H) 11/09/2023 1341   CALCIUM  10.4 (H) 11/09/2023 1341   PROT 7.2 11/09/2023 1341   ALBUMIN 4.8 11/09/2023 1341  AST 54 (H) 11/09/2023 1341   ALT 51 (H) 11/09/2023 1341   ALKPHOS 106 11/09/2023 1341   BILITOT 0.7 11/09/2023 1341   GFRNONAA >90 05/04/2012 0813   GFRAA >90 05/04/2012 0813      Latest Ref Rng & Units 11/09/2023    1:41 PM 11/01/2023   11:42 AM 04/14/2023    9:08 AM  Hepatic Function  Total Protein 6.0 - 8.5 g/dL 7.2  7.4  6.5   Albumin 3.9 - 4.9 g/dL 4.8  4.7  4.4   AST 0 - 40 IU/L 54  46  32   ALT 0 - 32 IU/L 51  48  29   Alk Phosphatase 44 - 121 IU/L 106  123  119   Total Bilirubin 0.0 - 1.2 mg/dL 0.7  0.7  0.6       Current Medications:   Current Outpatient Medications (Endocrine & Metabolic):    FARXIGA  10 MG TABS tablet, Take 1 tablet by mouth once daily   metFORMIN  (GLUCOPHAGE ) 1000 MG tablet, Take 1 tablet (1,000 mg total) by mouth 2 (two) times daily with a meal.  Current Outpatient  Medications (Cardiovascular):    amLODipine  (NORVASC ) 10 MG tablet, Take 1 tablet by mouth once daily   atorvastatin  (LIPITOR) 20 MG tablet, Take 1 tablet (20 mg total) by mouth daily.   metoprolol  tartrate (LOPRESSOR ) 100 MG tablet, Take 1 tablet (100 mg total) by mouth 2 (two) times daily.   olmesartan  (BENICAR ) 40 MG tablet, Take 1 tablet (40 mg total) by mouth daily.  Current Outpatient Medications (Respiratory):    promethazine  (PHENERGAN ) 25 MG tablet, Take 1 tablet (25 mg total) by mouth every 8 (eight) hours as needed for nausea or vomiting.    Current Outpatient Medications (Other):    metoCLOPramide  (REGLAN ) 5 MG tablet, Take 1 tablet (5 mg total) by mouth every 8 (eight) hours as needed for up to 10 days for nausea or vomiting.   omeprazole  (PRILOSEC) 40 MG capsule, Take 1 capsule by mouth once daily   rOPINIRole  (REQUIP ) 2 MG tablet, TAKE 1 TABLET BY MOUTH AT BEDTIME   sucralfate  (CARAFATE ) 1 g tablet, Take 1 tablet (1 g total) by mouth 4 (four) times daily -  with meals and at bedtime.   Vitamin D , Ergocalciferol , (DRISDOL ) 1.25 MG (50000 UNIT) CAPS capsule, Take 1 capsule (50,000 Units total) by mouth every 7 (seven) days.  Medical History:  Past Medical History:  Diagnosis Date   Diabetes mellitus without complication (HCC)    Renal cancer (HCC)    Allergies:  Allergies  Allergen Reactions   Ibuprofen Other (See Comments) and Nausea And Vomiting    Due to history of kidney cancer Other reaction(s): Other (See Comments) Due to history of kidney cancer Other reaction(s): Other (See Comments) Other reaction(s): Other (See Comments) Due to history of kidney cancer Due to history of kidney cancer Due to history of kidney cancer Other reaction(s): Other (See Comments) Due to history of kidney cancer   Lisinopril Cough   Codeine Rash   Penicillins Nausea And Vomiting and Rash   Sulfa Antibiotics Nausea And Vomiting and Rash     Surgical History:  She  has a past  surgical history that includes Nephrectomy; Abdominal hysterectomy; Tonsillectomy; Cholecystectomy; Breast cyst aspiration (Bilateral); and Breast biopsy (Left, 08/27/2022). Family History:  Her family history includes Breast cancer in her maternal aunt; Diabetes in her maternal grandfather, maternal grandmother, and paternal grandmother; Hypertension in her maternal grandfather, maternal grandmother,  paternal grandfather, and paternal grandmother.  REVIEW OF SYSTEMS  : All other systems reviewed and negative except where noted in the History of Present Illness.  PHYSICAL EXAM: BP (!) 178/116   Pulse 85   Ht 5' 4 (1.626 m)   Wt 219 lb (99.3 kg)   SpO2 96%   BMI 37.59 kg/m  Physical Exam   VITALS: BP- 170/100 GENERAL APPEARANCE: Well nourished, in no apparent distress. HEENT: No cervical lymphadenopathy, unremarkable thyroid , sclerae anicteric, conjunctiva pink. RESPIRATORY: Respiratory effort normal, breath sounds equal bilaterally without rales, rhonchi, or wheezing. CARDIO: Regular rate and rhythm with no murmurs, rubs, or gallops, peripheral pulses intact. ABDOMEN: Soft, non-distended, active bowel sounds in all four quadrants, tenderness in the upper abdomen, no rebound, no mass appreciated. RECTAL: Declines. MUSCULOSKELETAL: Full range of motion, normal gait, without edema. SKIN: Dry, intact without rashes or lesions. No jaundice. NEURO: Alert, oriented, no focal neurological deficits. PSYCH: Cooperative, normal mood and affect.      Alan JONELLE Coombs, PA-C 3:13 PM

## 2024-01-27 NOTE — Patient Instructions (Addendum)
 Advised to go to the ER if there is any severe weakness, severe abdominal pain, vomit blood, dark red blood in your bowel movement, shortness of breath or chest pain.   Your provider has requested that you go to the basement level for lab work before leaving today. Press B on the elevator. The lab is located at the first door on the left as you exit the elevator.  Due to recent changes in healthcare laws, you may see the results of your imaging and laboratory studies on MyChart before your provider has had a chance to review them.  We understand that in some cases there may be results that are confusing or concerning to you. Not all laboratory results come back in the same time frame and the provider may be waiting for multiple results in order to interpret others.  Please give us  48 hours in order for your provider to thoroughly review all the results before contacting the office for clarification of your results.    VISIT SUMMARY:  During your visit, we discussed your persistent nausea, vomiting, and diarrhea, which may be related to gastroparesis and a reaction to your blood pressure medication. We also addressed your uncontrolled high blood pressure and iron  deficiency anemia.  YOUR PLAN:  CHRONIC NAUSEA, VOMITING, AND DIARRHEA: Your symptoms suggest gastroparesis and a possible reaction to your blood pressure medication, olmesartan . -Follow a gastroparesis diet as provided. -Take metoclopramide  as prescribed for nausea and gastroparesis. -Take Carafate  as prescribed for potential bile reflux. -Get lab tests for celiac disease and a lipase recheck. -Go to the ER or urgent care for a brain scan to rule out a brain bleed. -Follow up with your primary care doctor for blood pressure management.  IRON  DEFICIENCY ANEMIA: Your anemia may be due to gastrointestinal blood loss or malabsorption. -Schedule an endoscopy and colonoscopy once your blood pressure is under control.  UNCONTROLLED  HYPERTENSION: Your high blood pressure is concerning, especially with your history of kidney removal. -Go to the ER or urgent care for immediate blood pressure management. -Follow up with your primary care doctor for ongoing hypertension management. -Consider switching from olmesartan  due to potential gastrointestinal side effects.  Reglan  or metoclopramide   Can be used for gastroparesis or slow stomach, nausea, vomiting, GERD.   Continue the medication as needed up to 3 times a day, on an empty stomach 30 minutes before eating. It may take a few weeks for your stomach condition to start to get better. However, do not take this medicine for longer than 12 weeks.  The longer you take this medicine, and the more you take it, the greater your chances are of developing serious side effects.  Some people may get a severe muscle problem called tardive dyskinesia. This problem may lessen or go away after stopping this drug, but it may not go away. The risk is greater with diabetes and in older adults, especially older females. The risk is greater with longer use or higher doses, but it may also occur after short-term use with low doses. Call your doctor right away if you have trouble controlling body movements or problems with your tongue, face, mouth, or jaw like tongue sticking out, puffing cheeks, mouth puckering, or chewing.   Please monitor for worsening depression or thoughts of suicide, any aggressiveness or hyperactivity.  If this happen please stop and call your physician right away.    Gastroparesis Gastroparesis is a condition in which food takes longer than normal to empty from the stomach.  This condition is also known as delayed gastric emptying. It is usually a long-term (chronic) condition.  What are the signs or symptoms? Symptoms of this condition include: Feeling full after eating very little or a loss of appetite. Nausea, vomiting, or heartburn. Bloating of your  abdomen. Inconsistent blood sugar (glucose) levels on blood tests. Unexplained weight loss. Acid from the stomach coming up into the esophagus (gastroesophageal reflux). Sudden tightening (spasm) of the stomach, which can be painful. Symptoms may come and go. Some people may not notice any symptoms.  What increases the risk? You are more likely to develop this condition if: You have certain disorders or diseases. These may include: An endocrine disorder. An eating disorder. Amyloidosis. Scleroderma. Parkinson's disease. Multiple sclerosis. Cancer or infection of the stomach or the vagus nerve. You have had surgery on your stomach or vagus nerve. You take certain medicines. You are female.  Things you can do: Please do small frequent meals like 4-6 meals a day.  Eat and drink liquids at separate times.  Avoid high fiber foods, cook your vegetables, avoid high fat food.  Suggest spreading protein throughout the day (greek yogurt, glucerna, soft meat, milk, eggs) Choose soft foods that you can mash with a fork When you are more symptomatic, change to pureed foods foods and liquids.  Consider reading Living well with Gastroparesis by [facility] Check out this link to a diet online https://my.groupjournal.fr   Thank you for trusting me with your gastrointestinal care!   Alan Coombs, PA-C  _______________________________________________________  If your blood pressure at your visit was 140/90 or greater, please contact your primary care physician to follow up on this.  _______________________________________________________  If you are age 41 or older, your body mass index should be between 23-30. Your Body mass index is 37.59 kg/m. If this is out of the aforementioned range listed, please consider follow up with your Primary Care Provider.  If you are age 53 or younger, your  body mass index should be between 19-25. Your Body mass index is 37.59 kg/m. If this is out of the aformentioned range listed, please consider follow up with your Primary Care Provider.   ________________________________________________________  The [facility] providers would like to encourage you to use MYCHART to communicate with providers for non-urgent requests or questions.  Due to long hold times on the telephone, sending your provider a message by [facility] may be a faster and more efficient way to get a response.  Please allow 48 business hours for a response.  Please remember that this is for non-urgent requests.  _______________________________________________________  [facility] is using a team-based approach to care.  Your team is made up of your doctor and two to three APPS. Our APPS (Nurse Practitioners and Physician Assistants) work with your physician to ensure care continuity for you. They are fully qualified to address your health concerns and develop a treatment plan. They communicate directly with your gastroenterologist to care for you. Seeing the Advanced Practice Practitioners on your physician's team can help you by facilitating care more promptly, often allowing for earlier appointments, access to diagnostic testing, procedures, and other specialty referrals.

## 2024-01-28 LAB — TISSUE TRANSGLUTAMINASE, IGA: (tTG) Ab, IgA: <1 U/mL

## 2024-01-28 LAB — IGA: Immunoglobulin A: 201 mg/dL (ref 47–310)

## 2024-01-30 ENCOUNTER — Ambulatory Visit: Payer: Self-pay | Admitting: Physician Assistant

## 2024-02-10 ENCOUNTER — Ambulatory Visit: Admitting: Physician Assistant

## 2024-02-16 ENCOUNTER — Other Ambulatory Visit: Payer: Self-pay | Admitting: Physician Assistant

## 2024-02-16 DIAGNOSIS — I10 Essential (primary) hypertension: Secondary | ICD-10-CM

## 2024-03-05 ENCOUNTER — Encounter

## 2024-03-11 ENCOUNTER — Other Ambulatory Visit: Payer: Self-pay | Admitting: Physician Assistant

## 2024-03-11 DIAGNOSIS — G2581 Restless legs syndrome: Secondary | ICD-10-CM

## 2024-03-13 ENCOUNTER — Other Ambulatory Visit: Payer: Self-pay

## 2024-03-13 DIAGNOSIS — N182 Chronic kidney disease, stage 2 (mild): Secondary | ICD-10-CM

## 2024-03-13 DIAGNOSIS — G2581 Restless legs syndrome: Secondary | ICD-10-CM

## 2024-03-13 HISTORY — DX: Morbid (severe) obesity due to excess calories: E66.01

## 2024-03-13 HISTORY — DX: Chronic kidney disease, stage 2 (mild): N18.2

## 2024-03-13 MED ORDER — ROPINIROLE HCL 2 MG PO TABS: 2.0000 mg | ORAL_TABLET | Freq: Every day | ORAL | 0 refills | Status: AC

## 2024-03-13 MED ORDER — ROPINIROLE HCL 2 MG PO TABS: 2.0000 mg | ORAL_TABLET | Freq: Every day | ORAL | 0 refills | Status: DC

## 2024-03-14 ENCOUNTER — Other Ambulatory Visit: Payer: Self-pay | Admitting: Physician Assistant

## 2024-03-14 ENCOUNTER — Ambulatory Visit: Admitting: Physician Assistant

## 2024-03-14 DIAGNOSIS — E1165 Type 2 diabetes mellitus with hyperglycemia: Secondary | ICD-10-CM

## 2024-03-14 NOTE — Progress Notes (Deleted)
 "    03/14/2024 Kelly Wilkins [id number] October 26, 1975  Referring provider: Nicholaus Credit, PA-C Primary GI doctor: Dr. 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 Current medications include metoprolol , olmesartan , and amlodipine . Olmesartan  may contribute to gastrointestinal symptoms.Has Headache, dizziness, nausea and vomiting.  - Advised ER or urgent care for immediate blood pressure management. - Recommended follow-up with primary care for hypertension management. - Suggested considering switching olmesartan  due to potential enteropathy. - schedule EGD/colon for IDA/symptom AFTER blood pressure is improved and evaluated  IDA/B12 deficiency S/p total hysterectomy 01/27/2024  HGB 12.8 MCV 80.9 Platelets 337.0 01/27/2024 Iron  19 Ferritin 7.5 B12 328 Recent Labs    04/14/23 0908 11/01/23 1142 11/09/23 1341 12/08/23 1522 01/27/24 1600  HGB 13.0 14.6 14.0 13.1 12.8   Get EGD colonoscopy WHEN her blood pressure is better controlled, sent to ER for evaluation with headache, dizziness and history of nephrectomy Close follow up 4 weeks.  ER precautions discussed  Nausea and vomiting intermittently x3-4 months  every day, can have right nose bleed, food vomitus, no blood Post prandial AB pain with nausea/vomiting, worse with solid foods, chest pain with esophageal spasm Has lost about 60 lbs over 4-5 months Has had mildly elevated lipase/amylase 2018 EGD at [facility] unremarkable other than [facility] 08/21/2023 CTAP W fatty infiltration of liver no splenomegaly or ascites, s/p cholecystectomy  01/26/2024 celiac negative ? Gastroparesis, GERD, HTN, renal, pancreatitis - Provided gastroparesis diet information. - Prescribed metoclopramide  for nausea and gastroparesis. - Prescribed Carafate  for potential bile reflux. - Ordered labs for celiac disease and lipase recheck. - Recommended ER or  urgent care for brain scan to rule out brain bleed. - Advised follow-up with primary care for blood pressure management.  Diarrhea  can look oily, 2 x a day, denies hematochezia S/p cholecystectomy  10/15/2021 colonoscopy with 2-day prep GoLytely -prep unremarkable recall 5 years 08/21/2023 CTAP W post nephrectomy changes on the right nonobstructing left renal calculus fatty infiltration of the liver 01/2024 negative celiac and sed rate CRP -Will send a message to Lauraine Nicholaus suggesting switching olmesartan  to losartan or valsartan -Possible bile acid diarrhea, consider colestipol - check celiac, ESR, CRP  Fatty liver    Latest Ref Rng & Units 01/27/2024    4:00 PM 11/09/2023    1:41 PM 11/01/2023   11:42 AM  Hepatic Function  Total Protein 6.0 - 8.3 g/dL 7.2  7.2  7.4   Albumin 3.5 - 5.2 g/dL 4.4  4.8  4.7   AST 0 - 37 U/L 24  54  46   ALT 0 - 35 U/L 26  51  48   Alk Phosphatase 39 - 117 U/L 98  106  123   Total Bilirubin 0.2 - 1.2 mg/dL 0.4  0.7  0.7   Platelets 337 01/27/2024 fib four 0.70 08/21/2023 CTAP W fatty infiltration of liver no splenomegaly or ascites Continue to monitor CBC LFTs every 6 months Consider elastography or FibroSure when becomes available Consider hepatocellular workup if LFT elevation  Personal history of tubular adenomatous polyps 04/2016 tubular adenomatous polyp ascending colon Needs recall colonoscopy  Renal carcinoma S/p right nephrectomy 2012  Patient Care Team: Nicholaus Credit, DEVONNA as PCP - General (Physician Assistant)  HISTORY OF PRESENT ILLNESS: 49 y.o. female with a past medical history listed below presents for evaluation of nausea, vomiting, diarrhea, found to have elevated blood pressure.   I last saw the patient  in the office 01/27/2024.  For IDA, nausea vomiting diarrhea fatty liver.  Patient had uncontrolled blood pressure in the office 178/116 EGD colonoscopy not pursued here for close follow-up.  Discussed the use of AI scribe  software for clinical note transcription with the patient, who gave verbal consent to proceed.  History of Present Illness          She  reports that she has quit smoking. Her smoking use included cigarettes. She does not have any smokeless tobacco history on file. She reports current alcohol use. She reports that she does not use drugs.  RELEVANT GI HISTORY, IMAGING AND LABS: Results          CBC    Component Value Date/Time   WBC 8.3 01/27/2024 1600   RBC 4.89 01/27/2024 1600   HGB 12.8 01/27/2024 1600   HGB 13.1 12/08/2023 1522   HCT 39.6 01/27/2024 1600   HCT 42.0 12/08/2023 1522   PLT 337.0 01/27/2024 1600   PLT 372 12/08/2023 1522   MCV 80.9 01/27/2024 1600   MCV 86 12/08/2023 1522   MCH 26.7 12/08/2023 1522   MCH 27.4 05/04/2012 0813   MCHC 32.4 01/27/2024 1600   RDW 16.4 (H) 01/27/2024 1600   RDW 16.9 (H) 12/08/2023 1522   LYMPHSABS 3.2 01/27/2024 1600   LYMPHSABS 3.4 (H) 12/08/2023 1522   MONOABS 0.8 01/27/2024 1600   EOSABS 0.1 01/27/2024 1600   EOSABS 0.1 12/08/2023 1522   BASOSABS 0.1 01/27/2024 1600   BASOSABS 0.1 12/08/2023 1522   Recent Labs    04/14/23 0908 11/01/23 1142 11/09/23 1341 12/08/23 1522 01/27/24 1600  HGB 13.0 14.6 14.0 13.1 12.8    CMP     Component Value Date/Time   NA 143 01/27/2024 1600   NA 141 11/09/2023 1341   K 4.1 01/27/2024 1600   CL 104 01/27/2024 1600   CO2 26 01/27/2024 1600   GLUCOSE 178 (H) 01/27/2024 1600   BUN 13 01/27/2024 1600   BUN 14 11/09/2023 1341   CREATININE 0.89 01/27/2024 1600   CALCIUM  9.2 01/27/2024 1600   PROT 7.2 01/27/2024 1600   PROT 7.2 11/09/2023 1341   ALBUMIN 4.4 01/27/2024 1600   ALBUMIN 4.8 11/09/2023 1341   AST 24 01/27/2024 1600   ALT 26 01/27/2024 1600   ALKPHOS 98 01/27/2024 1600   BILITOT 0.4 01/27/2024 1600   BILITOT 0.7 11/09/2023 1341   GFRNONAA >90 05/04/2012 0813   GFRAA >90 05/04/2012 0813      Latest Ref Rng & Units 01/27/2024    4:00 PM 11/09/2023    1:41 PM  11/01/2023   11:42 AM  Hepatic Function  Total Protein 6.0 - 8.3 g/dL 7.2  7.2  7.4   Albumin 3.5 - 5.2 g/dL 4.4  4.8  4.7   AST 0 - 37 U/L 24  54  46   ALT 0 - 35 U/L 26  51  48   Alk Phosphatase 39 - 117 U/L 98  106  123   Total Bilirubin 0.2 - 1.2 mg/dL 0.4  0.7  0.7       Current Medications:   Current Outpatient Medications (Endocrine & Metabolic):    FARXIGA  10 MG TABS tablet, Take 1 tablet by mouth once daily   metFORMIN  (GLUCOPHAGE ) 1000 MG tablet, Take 1 tablet (1,000 mg total) by mouth 2 (two) times daily with a meal.  Current Outpatient Medications (Cardiovascular):    amLODipine  (NORVASC ) 10 MG tablet, Take 1 tablet by mouth once  daily   atorvastatin  (LIPITOR) 20 MG tablet, Take 1 tablet (20 mg total) by mouth daily.   metoprolol  tartrate (LOPRESSOR ) 100 MG tablet, Take 1 tablet (100 mg total) by mouth 2 (two) times daily.   olmesartan  (BENICAR ) 40 MG tablet, Take 1 tablet (40 mg total) by mouth daily.  Current Outpatient Medications (Respiratory):    promethazine  (PHENERGAN ) 25 MG tablet, Take 1 tablet (25 mg total) by mouth every 8 (eight) hours as needed for nausea or vomiting.  Current Outpatient Medications (Other):    metoCLOPramide  (REGLAN ) 5 MG tablet, Take 1 tablet (5 mg total) by mouth every 8 (eight) hours as needed for up to 10 days for nausea or vomiting.   omeprazole  (PRILOSEC) 40 MG capsule, Take 1 capsule by mouth once daily   rOPINIRole  (REQUIP ) 2 MG tablet, Take 1 tablet (2 mg total) by mouth at bedtime.   sucralfate  (CARAFATE ) 1 g tablet, Take 1 tablet (1 g total) by mouth 4 (four) times daily -  with meals and at bedtime.   Vitamin D , Ergocalciferol , (DRISDOL ) 1.25 MG (50000 UNIT) CAPS capsule, Take 1 capsule (50,000 Units total) by mouth every 7 (seven) days.  Medical History:  Past Medical History:  Diagnosis Date   Diabetes mellitus without complication (HCC)    Renal cancer (HCC)    Allergies:  Allergies  Allergen Reactions   Ibuprofen  Other (See Comments) and Nausea And Vomiting    Due to history of kidney cancer Other reaction(s): Other (See Comments) Due to history of kidney cancer Other reaction(s): Other (See Comments) Other reaction(s): Other (See Comments) Due to history of kidney cancer Due to history of kidney cancer Due to history of kidney cancer Other reaction(s): Other (See Comments) Due to history of kidney cancer   Lisinopril Cough   Codeine Rash   Penicillins Nausea And Vomiting and Rash   Sulfa Antibiotics Nausea And Vomiting and Rash     Surgical History:  She  has a past surgical history that includes Nephrectomy; Abdominal hysterectomy; Tonsillectomy; Cholecystectomy; Breast cyst aspiration (Bilateral); and Breast biopsy (Left, 08/27/2022). Family History:  Her family history includes Breast cancer in her maternal aunt; Diabetes in her maternal grandfather, maternal grandmother, and paternal grandmother; Hypertension in her maternal grandfather, maternal grandmother, paternal grandfather, and paternal grandmother.  REVIEW OF SYSTEMS  : All other systems reviewed and negative except where noted in the History of Present Illness.  PHYSICAL EXAM: There were no vitals taken for this visit. Physical Exam          Alan JONELLE Coombs, PA-C 7:50 AM   "

## 2024-03-16 ENCOUNTER — Encounter: Payer: Self-pay | Admitting: Physician Assistant

## 2024-03-16 LAB — LAB REPORT - SCANNED

## 2024-03-19 ENCOUNTER — Ambulatory Visit: Payer: Self-pay

## 2024-03-19 NOTE — Telephone Encounter (Signed)
 FYI Only or Action Required?: FYI only for provider: appointment scheduled on 03/20/24.  Patient was last seen in primary care on 12/22/2023 by Milon Cleaves, PA.  Called Nurse Triage reporting Vomiting and Hypertension.  Symptoms began several days ago.  Interventions attempted: Prescription medications: amlodipine , metoprolol , olmesartan , clonidine .  Symptoms are: stable.  Triage Disposition: See PCP When Office is Open (Within 3 Days)  Patient/caregiver understands and will follow disposition?: Yes               Message from [facility] sent at 03/19/2024  8:09 AM EST  Summary: Vomiting, hosp fu   Reason for Triage: Pt cannot stop vomiting, needs a hosp fu  Best contact: [phone]         Reason for Disposition  [1] Taking BP medications AND [2] feels is having side effects (e.g., impotence, cough, dizzy upon standing)  Answer Assessment - Initial Assessment Questions Patient states she has had issues with vomiting for close to a year now but that it was improved until recently flaring back up over the past month. She states Saturday her BP was 203/119 but she started the new clonidine  medication yesterday and BP has improved. She states when she went to the ED on Friday she was told they could admit her or she could follow up with her PCP this week. Patient states she chose to follow up with PCP. Scheduled for visit tomorrow with PCP and reviewed emergent symptoms with patient.     1. BLOOD PRESSURE: What is your blood pressure? Did you take at least two measurements 5 minutes apart?     156/98. HR 113.  2. ONSET: When did you take your blood pressure?     This morning.  3. HOW: How did you take your blood pressure? (e.g., automatic home BP monitor, visiting nurse)     Automatic BP wrist monitor.  4. HISTORY: Do you have a history of high blood pressure?     Yes.  5. MEDICINES: Are you taking any medicines for blood pressure? Have you  missed any doses recently?     New med prescribed by [facility] ED, clonidine  0.2mg  bid (started yesterday causing dry mouth). Still taking olmesartan , amlodipine , metoprolol . No missed doses.   6. OTHER SYMPTOMS: Do you have any symptoms? (e.g., blurred vision, chest pain, difficulty breathing, headache, weakness)     5/10 headache, Feels like I'm in a dream fatigue, sluggish; 2 episodes of vomiting in the last 24 hours, Neck jumping from where my heart is beating so hard. No chest pain, SOB, unilateral numbness or weakness, changes in speech or vision  Protocols used: Blood Pressure - High-A-AH

## 2024-03-20 ENCOUNTER — Ambulatory Visit: Admitting: Physician Assistant

## 2024-03-20 ENCOUNTER — Encounter: Payer: Self-pay | Admitting: Physician Assistant

## 2024-03-20 VITALS — BP 140/86 | HR 87 | Temp 97.3°F | Resp 18 | Ht 64.0 in | Wt 216.4 lb

## 2024-03-20 DIAGNOSIS — E1165 Type 2 diabetes mellitus with hyperglycemia: Secondary | ICD-10-CM

## 2024-03-20 DIAGNOSIS — Z7984 Long term (current) use of oral hypoglycemic drugs: Secondary | ICD-10-CM

## 2024-03-20 DIAGNOSIS — R112 Nausea with vomiting, unspecified: Secondary | ICD-10-CM | POA: Diagnosis not present

## 2024-03-20 DIAGNOSIS — I1A0 Resistant hypertension: Secondary | ICD-10-CM | POA: Diagnosis not present

## 2024-03-20 LAB — POCT URINALYSIS DIP (CLINITEK)
Bilirubin, UA: NEGATIVE
Blood, UA: NEGATIVE
Glucose, UA: >=1000 mg/dL — AB
Ketones, POC UA: NEGATIVE mg/dL
Leukocytes, UA: NEGATIVE
Nitrite, UA: NEGATIVE
POC PROTEIN,UA: NEGATIVE
Spec Grav, UA: 1.02
Urobilinogen, UA: 0.2 U/dL
pH, UA: 5.5

## 2024-03-20 NOTE — Assessment & Plan Note (Addendum)
" °  Orders:   Comprehensive metabolic panel with GFR   Hemoglobin A1c   POCT URINALYSIS DIP (CLINITEK)   Microalbumin/Creatinine Ratio, Urine  "

## 2024-03-20 NOTE — Progress Notes (Signed)
 "  Subjective:  Patient ID: Kelly Wilkins, female    DOB: Apr 06, 1975  Age: 49 y.o. MRN: [id number]  Chief Complaint  Patient presents with   Hypertension    HPI Pt in today for follow up of hypertension - pt states she was seen at ED recently because blood pressure was significantly elevated.  She was placed on clonidine  0.2mg  to take up to bid if systolic reading above 150.  She states she is regularly taking it bid because bp has still been elevated.  She is also taking hydrochlorothiazide , norvasc  10, lopressor  100mg  bid and olmesartan  40mg  qd.  She has never seen cardiology and I would like her to have referral for resistant hypertension.    Pt with history of kidney cancer resulting in right nephrectomy.  She finally went for follow up with nephrologist last week.  She states she was told labs were stable and no further imaging/testing was done.  BP was elevated at that office as well Pt did have CT abdomen/pelvis in 8/25 which showed normal adrenal glands.  Do not see that patient has had renal studies recently and recommend a doppler to evaluate left kidney for renal artery stenosis  Pt states that she has started having nausea and decreased appetite again - she was worked up several months ago and sent to GI for further evaluation.  Her blood pressure was noted to be extremely elevated at their office and colonoscopy and endoscopy was deferred.  Pt did not contact our office nor take care of elevated blood pressure and that was in November.  She has upcoming appt with GI for follow up  Pt with diabetes.  She states glucose has not been 'too high' but did stop rybelsus  several months ago when she was having abdominal cramps and nausea.  She is overdue for labwork     11/25/2023   10:18 AM 11/09/2023    1:10 PM 11/01/2023   10:16 AM 05/03/2023    9:53 AM 04/14/2023    8:36 AM  Depression screen PHQ 2/9  Decreased Interest 0 0 0 0 0  Down, Depressed, Hopeless 0 0 0 0 0  PHQ - 2 Score 0 0 0  0 0  Altered sleeping     3  Tired, decreased energy     1  Change in appetite     1  Feeling bad or failure about yourself      0  Trouble concentrating     0  Moving slowly or fidgety/restless     0  Suicidal thoughts     0  PHQ-9 Score     5   Difficult doing work/chores     Not difficult at all     Data saved with a previous flowsheet row definition        04/14/2023    8:36 AM 05/03/2023    9:53 AM 11/01/2023   10:16 AM 11/09/2023    1:10 PM 11/25/2023   10:18 AM  Fall Risk  Falls in the past year? 0 0 0 1 1  Was there an injury with Fall? 0  0  0  1  1   Fall Risk Category Calculator 0 0 0 2 2  Patient at Risk for Falls Due to No Fall Risks No Fall Risks No Fall Risks History of fall(s) History of fall(s)  Fall risk Follow up Falls evaluation completed  Falls evaluation completed  Falls evaluation completed     Data saved with a  previous flowsheet row definition     ROS CONSTITUTIONAL: has had some mild fatigue with sweats E/N/T: Negative for ear pain, nasal congestion and sore throat.  CARDIOVASCULAR: Negative for chest pain, dizziness, palpitations and pedal edema.  RESPIRATORY: Negative for recent cough and dyspnea.  GASTROINTESTINAL: see HPI MSK: Negative for arthralgias and myalgias.  INTEGUMENTARY: Negative for rash.  NEUROLOGICAL: occasional headaches PSYCHIATRIC: Negative for sleep disturbance and to question depression screen.  Negative for depression, negative for anhedonia.   Current Medications[1]  Past Medical History:  Diagnosis Date   Diabetes mellitus without complication (HCC)    Renal cancer (HCC)    Objective:  PHYSICAL EXAM:   BP (!) 140/86   Pulse 87   Temp (!) 97.3 F (36.3 C) (Temporal)   Resp 18   Ht 5' 4 (1.626 m)   Wt 216 lb 6.4 oz (98.2 kg)   SpO2 98%   BMI 37.14 kg/m    GEN: Well nourished, well developed, in no acute distress  Cardiac: RRR; no murmurs, rubs, or gallops,no edema - n Respiratory:  normal respiratory rate and  pattern with no distress - normal breath sounds with no rales, rhonchi, wheezes or rubs GI: normal bowel sounds, no masses or tenderness Skin: warm and dry, no rash  Neuro:  Alert and Oriented x 3, - CN II-Xii grossly intact Psych: euthymic mood, appropriate affect and demeanor    Assessment & Plan Resistant hypertension Continue meds as dircted Orders:   CBC with Differential/Platelet   Comprehensive metabolic panel with GFR   TSH   US  Renal Artery Stenosis; Future   Metanephrines, Pheochromocyt; Future   Ambulatory referral to Cardiology  Type 2 diabetes mellitus with hyperglycemia, without long-term current use of insulin (HCC)  Orders:   Comprehensive metabolic panel with GFR   Hemoglobin A1c   POCT URINALYSIS DIP (CLINITEK)   Microalbumin/Creatinine Ratio, Urine  Nausea and vomiting, unspecified vomiting type Follow up with GI as scheduled Orders:   Lipase   Amylase    Follow-up: Return in about 4 weeks (around 04/17/2024) for follow-up.  An After Visit Summary was printed and given to the patient.  SARA R Khai Torbert, PA-C [facility] [phone]     [1]  Current Outpatient Medications:    amLODipine  (NORVASC ) 10 MG tablet, Take 1 tablet by mouth once daily, Disp: 90 tablet, Rfl: 0   atorvastatin  (LIPITOR) 20 MG tablet, Take 1 tablet (20 mg total) by mouth daily., Disp: 90 tablet, Rfl: 0   cloNIDine  (CATAPRES ) 0.2 MG tablet, Take 0.2 mg by mouth as needed (blood pressure is greater than 150 systolic after your other blood pressure medications,)., Disp: , Rfl:    FARXIGA  10 MG TABS tablet, Take 1 tablet by mouth once daily, Disp: 90 tablet, Rfl: 0   gabapentin  (NEURONTIN ) 300 MG capsule, Take 300 mg by mouth 2 (two) times daily., Disp: , Rfl:    hydrochlorothiazide  (HYDRODIURIL ) 25 MG tablet, Take 25 mg by mouth., Disp: , Rfl:    metFORMIN  (GLUCOPHAGE ) 1000 MG tablet, Take 1 tablet (1,000 mg total) by mouth 2 (two) times daily with a meal., Disp: 180  tablet, Rfl: 1   metoprolol  tartrate (LOPRESSOR ) 100 MG tablet, Take 1 tablet (100 mg total) by mouth 2 (two) times daily., Disp: 180 tablet, Rfl: 1   olmesartan  (BENICAR ) 40 MG tablet, Take 1 tablet (40 mg total) by mouth daily., Disp: 90 tablet, Rfl: 1   omeprazole  (PRILOSEC) 40 MG capsule, Take 1 capsule by mouth  once daily, Disp: 90 capsule, Rfl: 0   ondansetron  (ZOFRAN ) 4 MG tablet, Take 4 mg by mouth., Disp: , Rfl:    rOPINIRole  (REQUIP ) 2 MG tablet, Take 1 tablet (2 mg total) by mouth at bedtime., Disp: 90 tablet, Rfl: 0   sucralfate  (CARAFATE ) 1 g tablet, Take 1 tablet (1 g total) by mouth 4 (four) times daily -  with meals and at bedtime., Disp: 120 tablet, Rfl: 0   Vitamin D , Ergocalciferol , (DRISDOL ) 1.25 MG (50000 UNIT) CAPS capsule, Take 1 capsule (50,000 Units total) by mouth every 7 (seven) days., Disp: 5 capsule, Rfl: 5   metoCLOPramide  (REGLAN ) 5 MG tablet, Take 1 tablet (5 mg total) by mouth every 8 (eight) hours as needed for up to 10 days for nausea or vomiting., Disp: 30 tablet, Rfl: 0   promethazine  (PHENERGAN ) 25 MG tablet, Take 1 tablet (25 mg total) by mouth every 8 (eight) hours as needed for nausea or vomiting., Disp: 30 tablet, Rfl: 0  "

## 2024-03-20 NOTE — Assessment & Plan Note (Addendum)
 Follow up with GI as scheduled Orders:   Lipase   Amylase

## 2024-03-20 NOTE — Assessment & Plan Note (Addendum)
 Continue meds as dircted Orders:   CBC with Differential/Platelet   Comprehensive metabolic panel with GFR   TSH   US  Renal Artery Stenosis; Future   Metanephrines, Pheochromocyt; Future   Ambulatory referral to Cardiology

## 2024-03-21 ENCOUNTER — Ambulatory Visit: Payer: Self-pay | Admitting: Physician Assistant

## 2024-03-21 ENCOUNTER — Other Ambulatory Visit: Payer: Self-pay | Admitting: Physician Assistant

## 2024-03-21 DIAGNOSIS — R899 Unspecified abnormal finding in specimens from other organs, systems and tissues: Secondary | ICD-10-CM

## 2024-03-21 LAB — CBC WITH DIFFERENTIAL/PLATELET
Basophils Absolute: 0.1 x10E3/uL (ref 0.0–0.2)
Basos: 1 %
EOS (ABSOLUTE): 0.1 x10E3/uL (ref 0.0–0.4)
Eos: 1 %
Hematocrit: 38.9 % (ref 34.0–46.6)
Hemoglobin: 11.9 g/dL (ref 11.1–15.9)
Immature Grans (Abs): 0.1 x10E3/uL (ref 0.0–0.1)
Immature Granulocytes: 1 %
Lymphocytes Absolute: 3.4 x10E3/uL — ABNORMAL HIGH (ref 0.7–3.1)
Lymphs: 39 %
MCH: 24.1 pg — ABNORMAL LOW (ref 26.6–33.0)
MCHC: 30.6 g/dL — ABNORMAL LOW (ref 31.5–35.7)
MCV: 79 fL (ref 79–97)
Monocytes Absolute: 0.8 x10E3/uL (ref 0.1–0.9)
Monocytes: 9 %
Neutrophils Absolute: 4.4 x10E3/uL (ref 1.4–7.0)
Neutrophils: 49 %
Platelets: 400 x10E3/uL (ref 150–450)
RBC: 4.94 x10E6/uL (ref 3.77–5.28)
RDW: 15.2 % (ref 11.7–15.4)
WBC: 8.7 x10E3/uL (ref 3.4–10.8)

## 2024-03-21 LAB — COMPREHENSIVE METABOLIC PANEL WITH GFR
ALT: 32 IU/L (ref 0–32)
AST: 29 IU/L (ref 0–40)
Albumin: 4.7 g/dL (ref 3.9–4.9)
Alkaline Phosphatase: 102 IU/L (ref 41–116)
BUN/Creatinine Ratio: 26 — ABNORMAL HIGH (ref 9–23)
BUN: 23 mg/dL (ref 6–24)
Bilirubin Total: 0.4 mg/dL (ref 0.0–1.2)
CO2: 20 mmol/L (ref 20–29)
Calcium: 10 mg/dL (ref 8.7–10.2)
Chloride: 101 mmol/L (ref 96–106)
Creatinine, Ser: 0.87 mg/dL (ref 0.57–1.00)
Globulin, Total: 2 g/dL (ref 1.5–4.5)
Glucose: 122 mg/dL — ABNORMAL HIGH (ref 70–99)
Potassium: 5.5 mmol/L — ABNORMAL HIGH (ref 3.5–5.2)
Sodium: 139 mmol/L (ref 134–144)
Total Protein: 6.7 g/dL (ref 6.0–8.5)
eGFR: 81 mL/min/1.73

## 2024-03-21 LAB — MICROALBUMIN / CREATININE URINE RATIO
Creatinine, Urine: 87.2 mg/dL
Microalb/Creat Ratio: 16 mg/g{creat} (ref 0–29)
Microalbumin, Urine: 13.6 ug/mL

## 2024-03-21 LAB — HEMOGLOBIN A1C
Est. average glucose Bld gHb Est-mCnc: 192 mg/dL
Hgb A1c MFr Bld: 8.3 % — ABNORMAL HIGH (ref 4.8–5.6)

## 2024-03-21 LAB — LIPASE: Lipase: 60 U/L (ref 14–72)

## 2024-03-21 LAB — TSH: TSH: 1.65 u[IU]/mL (ref 0.450–4.500)

## 2024-03-21 LAB — AMYLASE: Amylase: 110 U/L (ref 31–110)

## 2024-03-22 ENCOUNTER — Ambulatory Visit

## 2024-03-22 VITALS — BP 142/80 | HR 80 | Ht 64.0 in | Wt 217.5 lb

## 2024-03-22 DIAGNOSIS — R002 Palpitations: Secondary | ICD-10-CM

## 2024-03-22 DIAGNOSIS — E782 Mixed hyperlipidemia: Secondary | ICD-10-CM

## 2024-03-22 DIAGNOSIS — I1A0 Resistant hypertension: Secondary | ICD-10-CM | POA: Diagnosis not present

## 2024-03-22 DIAGNOSIS — I10 Essential (primary) hypertension: Secondary | ICD-10-CM

## 2024-03-22 MED ORDER — CLONIDINE HCL 0.2 MG PO TABS: 0.2000 mg | ORAL_TABLET | Freq: Two times a day (BID) | ORAL | 3 refills | Status: AC

## 2024-03-22 NOTE — Progress Notes (Signed)
 "  Cardiology Consultation:    Date:  03/22/2024   ID:  Kelly Wilkins, DOB 1975-05-06, MRN [id number]  PCP:  Kelly Credit, PA-C  Cardiologist:  Kelly SAUNDERS Lounette Sloan, MD   Referring MD: Kelly Credit, PA-C   No chief complaint on file.    ASSESSMENT AND PLAN:   Kelly Wilkins 49 year old woman history of right renal cancer s/p nephrectomy, hypertension, diabetes, obesity, former smoker [quit 25 years ago], occasional alcohol consumption socially. Hiatal hernia, chronic nausea. No prior history of echocardiogram or stress test. Renal artery ultrasound ordered by PCP currently pending.   Here for further evaluation of resistant hypertension.  Problem List Items Addressed This Visit       Cardiovascular and Mediastinum   Benign hypertension   Relevant Medications   cloNIDine  (CATAPRES ) 0.2 MG tablet   Other Relevant Orders   ECHOCARDIOGRAM COMPLETE   Resistant hypertension   Improved blood pressure compared to recent ER visit.   Blood pressure below 130/80 mmHg. Continue amlodipine  10 mg once daily Continue clonidine  which was recently started she is taking 0.2 mg twice daily. Taking metoprolol  tartrate 100 mg twice a day Olmesartan  40 mg once daily. Continues hydrochlorothiazide  listed in her chart but she is not taking this at home.  Will remove this from her list.  - Continue current medications. If continues to remain suboptimal will switch metoprolol  to carvedilol.  Obtain transthoracic echocardiogram for cardiac structure and function assessment.       Relevant Medications   cloNIDine  (CATAPRES ) 0.2 MG tablet   Other Relevant Orders   EKG 12-Lead (Completed)   ECHOCARDIOGRAM COMPLETE     Other   Mixed hyperlipidemia - Primary   lipid panel 11/01/2023 total cholesterol 136, HDL 38, LDL 59 and triglycerides 246. Hemoglobin A1c 7.4.  Elevated triglycerides. Continue with dietary modifications and aggressive control of her diabetes which is likely contributing to  elevated triglycerides.  Continue atorvastatin  20 mg once daily.        Relevant Medications   cloNIDine  (CATAPRES ) 0.2 MG tablet   Other Relevant Orders   EKG 12-Lead (Completed)   ECHOCARDIOGRAM COMPLETE   Palpitations   Chronic symptoms but more recently worse for the past couple weeks.  Associated with night sweats Nocturnal symptoms. Likely related to hiatal hernia reflux symptoms but cannot exclude cardiac arrhythmias.  Will obtain Zio patch for 14 days.       Relevant Orders   ECHOCARDIOGRAM COMPLETE   LONG TERM MONITOR (3-14 DAYS)  Okay to proceed with her GI evaluation, EGD now that blood pressures have improved.. Return to clinic tentatively in 6 weeks.   History of Present Illness:    Kelly Wilkins is a 48 y.o. female who is being seen today for the evaluation of a persistent hypertension at the request of Kelly Wilkins, Kelly Wilkins.  Follows up with nephrologist in [place] .  Open here for the visit today by herself.  Lives with her husband.  Works as a youth worker for schools.  Walks regularly for exercise with her husband.  History of right renal cancer s/p nephrectomy, hypertension, diabetes, obesity, former smoker [quit 25 years ago], occasional alcohol consumption socially. Hiatal hernia, chronic nausea. No prior history of echocardiogram or stress test. Renal artery ultrasound ordered by PCP currently pending.  Was recently at [facility] ER 03/16/2024 for elevated blood pressures.  blood pressure in 190s.  High-sensitivity troponin markers were unremarkable. Clonidine  0.2 mg twice daily was added which has been taking consistently and notices improvement  in blood pressures.  EKG in the clinic today shows sinus rhythm heart rate 80/min, PR interval 144 ms, QRS duration 88 ms, QTc 442 ms no ischemic changes.  Mentions overall blood pressures have improved since starting clonidine  0.2 mg twice daily.  Requesting refill.  Mentions she has  been dealing with symptoms of night sweats and palpitations waking her up from sleep, with a sense of elevated heart rates and bounding pulse.  Symptoms resolve after sitting down for several minutes. Also has symptoms of shortness of breath occurring on and off notably with elevated blood pressures.  With chronic nausea underwent GI evaluation but EGD was not done due to uncontrolled blood pressures. With her blood pressure now improved should be okay to proceed with GI evaluation as needed.    Past Medical History:  Diagnosis Date   Abnormal mammogram 11/01/2023   Acute sore throat 12/02/2022   Acute upper respiratory infection 05/10/2023   Benign hypertension 12/02/2020   Chronic right hip pain 12/02/2020   Closed fracture of upper end of left fibula 10/25/2023   Colon cancer screening 12/02/2020   Contusion of left elbow 10/25/2023   Diabetic peripheral neuropathy associated with type 2 diabetes mellitus (HCC) 08/21/2023   Fever 11/26/2022   Gastroesophageal reflux disease without esophagitis 12/02/2020   Herpes zoster without complication 11/25/2023   History of kidney cancer 12/02/2020   Hypertension 08/21/2023   Influenza A 05/10/2023   Iron  deficiency anemia due to chronic blood loss 01/23/2021   Left foot pain 10/19/2023   Mixed hyperlipidemia 11/01/2023   Morbid obesity (HCC) 03/13/2024   Multiple closed fractures of left foot 10/25/2023   Nausea and vomiting 05/03/2023   OSA (obstructive sleep apnea) 11/01/2023   Plantar fasciitis of right foot 02/20/2018   Renal cancer (HCC)    Restless leg syndrome 12/02/2020   Sebaceous cyst 07/01/2016   Stage 2 chronic kidney disease 03/13/2024   Type 2 diabetes mellitus with hyperglycemia, without long-term current use of insulin (HCC) 11/01/2023   Upper respiratory tract infection due to COVID-19 virus 12/02/2022   Vitamin D  deficiency 11/01/2023    Past Surgical History:  Procedure Laterality Date   ABDOMINAL HYSTERECTOMY      BREAST BIOPSY Left 08/27/2022   MM LT BREAST BX W LOC DEV 1ST LESION IMAGE BX SPEC STEREO GUIDE 08/27/2022 [facility]   BREAST CYST ASPIRATION Bilateral    CHOLECYSTECTOMY     NEPHRECTOMY     TONSILLECTOMY      Current Medications: Active Medications[1]   Allergies:   Ibuprofen, Lisinopril, Codeine, Penicillins, and Sulfa antibiotics   Social History   Socioeconomic History   Marital status: Married    Spouse name: Not on file   Number of children: Not on file   Years of education: Not on file   Highest education level: Not on file  Occupational History   Not on file  Tobacco Use   Smoking status: Former    Types: Cigarettes   Smokeless tobacco: Not on file   Tobacco comments:    Stopped 12 years ago  Substance and Sexual Activity   Alcohol use: Yes    Comment: occasional   Drug use: No   Sexual activity: Not on file  Other Topics Concern   Not on file  Social History Narrative   Not on file   Social Drivers of Health   Tobacco Use: Medium Risk (03/22/2024)   Patient History    Smoking Tobacco Use: Former    Smokeless  Tobacco Use: Unknown    Passive Exposure: Not on file  Financial Resource Strain: Low Risk (09/07/2022)   Overall Financial Resource Strain (CARDIA)    Difficulty of Paying Living Expenses: Not hard at all  Food Insecurity: No Food Insecurity (10/24/2023)   Epic    Worried About Programme Researcher, Broadcasting/film/video in the Last Year: Never true    Ran Out of Food in the Last Year: Never true  Transportation Needs: No Transportation Needs (10/24/2023)   Epic    Lack of Transportation (Medical): No    Lack of Transportation (Non-Medical): No  Physical Activity: Sufficiently Active (09/07/2022)   Exercise Vital Sign    Days of Exercise per Week: 6 days    Minutes of Exercise per Session: 30 min  Stress: No Stress Concern Present (09/07/2022)   [employer] of Occupational Health - Occupational Stress Questionnaire    Feeling of Stress : Not at all   Social Connections: Moderately Isolated (09/07/2022)   Social Connection and Isolation Panel    Frequency of Communication with Friends and Family: More than three times a week    Frequency of Social Gatherings with Friends and Family: More than three times a week    Attends Religious Services: Never    Database Administrator or Organizations: No    Attends Banker Meetings: Never    Marital Status: Married  Depression (PHQ2-9): Low Risk (11/25/2023)   Depression (PHQ2-9)    PHQ-2 Score: 0  Alcohol Screen: Low Risk (09/07/2022)   Alcohol Screen    Last Alcohol Screening Score (AUDIT): 0  Housing: Low Risk (10/24/2023)   Epic    Unable to Pay for Housing in the Last Year: No    Number of Times Moved in the Last Year: 0    Homeless in the Last Year: No  Utilities: Not At Risk (10/24/2023)   Epic    Threatened with loss of utilities: No  Health Literacy: Adequate Health Literacy (12/02/2022)   B1300 Health Literacy    Frequency of need for help with medical instructions: Never     Family History: The patient's family history includes Breast cancer in her maternal aunt; Diabetes in her maternal grandfather, maternal grandmother, and paternal grandmother; Hypertension in her maternal grandfather, maternal grandmother, paternal grandfather, and paternal grandmother. ROS:   Please see the history of present illness.    All 14 point review of systems negative except as described per history of present illness.  EKGs/Labs/Other Studies Reviewed:    The following studies were reviewed today:   EKG:  EKG Interpretation Date/Time:  Thursday March 22 2024 14:36:21 EST Ventricular Rate:  80 PR Interval:  144 QRS Duration:  88 QT Interval:  384 QTC Calculation: 442 R Axis:   38  Text Interpretation: Normal sinus rhythm Normal ECG No previous ECGs available Confirmed by Liborio Hai reddy [phone]) on 03/22/2024 3:07:34 PM    Recent Labs: 03/20/2024: ALT 32; BUN 23;  Creatinine, Ser 0.87; Hemoglobin 11.9; Platelets 400; Potassium 5.5; Sodium 139; TSH 1.650  Recent Lipid Panel    Component Value Date/Time   CHOL 136 11/01/2023 1142   TRIG 246 (H) 11/01/2023 1142   HDL 38 (L) 11/01/2023 1142   CHOLHDL 3.6 11/01/2023 1142   LDLCALC 59 11/01/2023 1142    Physical Exam:    VS:  BP (!) 142/80   Pulse 80   Ht 5' 4 (1.626 m)   Wt 217 lb 8 oz (98.7 kg)   SpO2  99%   BMI 37.33 kg/m     Wt Readings from Last 3 Encounters:  03/22/24 217 lb 8 oz (98.7 kg)  03/20/24 216 lb 6.4 oz (98.2 kg)  01/27/24 219 lb (99.3 kg)     GENERAL:  Well nourished, well developed in no acute distress NECK: No JVD; No carotid bruits CARDIAC: RRR, S1 and S2 present, no murmurs, no rubs, no gallops CHEST:  Clear to auscultation without rales, wheezing or rhonchi  Extremities: No pitting pedal edema. Pulses bilaterally symmetric with radial 2+ and dorsalis pedis 2+ NEUROLOGIC:  Alert and oriented x 3  Medication Adjustments/Labs and Tests Ordered: Current medicines are reviewed at length with the patient today.  Concerns regarding medicines are outlined above.  Orders Placed This Encounter  Procedures   LONG TERM MONITOR (3-14 DAYS)   EKG 12-Lead   ECHOCARDIOGRAM COMPLETE   Meds ordered this encounter  Medications   cloNIDine  (CATAPRES ) 0.2 MG tablet    Sig: Take 1 tablet (0.2 mg total) by mouth 2 (two) times daily.    Dispense:  180 tablet    Refill:  3    Signed, Johnavon Mcclafferty reddy Lenni Reckner, MD, MPH, [facility]. 03/22/2024 3:35 PM    [facility]    [1]  Current Meds  Medication Sig   amLODipine  (NORVASC ) 10 MG tablet Take 1 tablet by mouth once daily   atorvastatin  (LIPITOR) 20 MG tablet Take 1 tablet (20 mg total) by mouth daily.   cloNIDine  (CATAPRES ) 0.2 MG tablet Take 1 tablet (0.2 mg total) by mouth 2 (two) times daily.   FARXIGA  10 MG TABS tablet Take 1 tablet by mouth once daily   metFORMIN  (GLUCOPHAGE ) 1000 MG tablet Take 1 tablet  (1,000 mg total) by mouth 2 (two) times daily with a meal.   metoCLOPramide  (REGLAN ) 5 MG tablet Take 1 tablet (5 mg total) by mouth every 8 (eight) hours as needed for up to 10 days for nausea or vomiting.   metoprolol  tartrate (LOPRESSOR ) 100 MG tablet Take 1 tablet (100 mg total) by mouth 2 (two) times daily.   olmesartan  (BENICAR ) 40 MG tablet Take 1 tablet (40 mg total) by mouth daily.   omeprazole  (PRILOSEC) 40 MG capsule Take 1 capsule by mouth once daily   ondansetron  (ZOFRAN ) 4 MG tablet Take 4 mg by mouth.   promethazine  (PHENERGAN ) 25 MG tablet Take 1 tablet (25 mg total) by mouth every 8 (eight) hours as needed for nausea or vomiting.   rOPINIRole  (REQUIP ) 2 MG tablet Take 1 tablet (2 mg total) by mouth at bedtime.   Vitamin D , Ergocalciferol , (DRISDOL ) 1.25 MG (50000 UNIT) CAPS capsule Take 1 capsule (50,000 Units total) by mouth every 7 (seven) days.   [DISCONTINUED] cloNIDine  (CATAPRES ) 0.2 MG tablet Take 0.2 mg by mouth as needed (blood pressure is greater than 150 systolic after your other blood pressure medications,).   [DISCONTINUED] hydrochlorothiazide  (HYDRODIURIL ) 25 MG tablet Take 25 mg by mouth.   "

## 2024-03-22 NOTE — Patient Instructions (Signed)
 Medication Instructions:  Your physician has recommended you make the following change in your medication:   START: Clonidine  0.2 mg two times daily  *If you need a refill on your cardiac medications before your next appointment, please call your pharmacy*  Lab Work: None If you have labs (blood work) drawn today and your tests are completely normal, you will receive your results only by: MyChart Message (if you have MyChart) OR A paper copy in the mail If you have any lab test that is abnormal or we need to change your treatment, we will call you to review the results.  Testing/Procedures: Your physician has requested that you have an echocardiogram. Echocardiography is a painless test that uses sound waves to create images of your heart. It provides your doctor with information about the size and shape of your heart and how well your hearts chambers and valves are working. This procedure takes approximately one hour. There are no restrictions for this procedure. Please do NOT wear cologne, perfume, aftershave, or lotions (deodorant is allowed). Please arrive 15 minutes prior to your appointment time.  Please note: We ask at that you not bring children with you during ultrasound (echo/ vascular) testing. Due to room size and safety concerns, children are not allowed in the ultrasound rooms during exams. Our front office staff cannot provide observation of children in our lobby area while testing is being conducted. An adult accompanying a patient to their appointment will only be allowed in the ultrasound room at the discretion of the ultrasound technician under special circumstances. We apologize for any inconvenience.  A zio monitor was ordered today. It will remain on for 14 days. You will then return monitor and event diary in provided box. It takes 1-2 weeks for report to be downloaded and returned to us . We will call you with the results. If monitor falls off or has orange flashing  light, please call Zio for further instructions.    Follow-Up: At [facility], you and your health needs are our priority.  As part of our continuing mission to provide you with exceptional heart care, our providers are all part of one team.  This team includes your primary Cardiologist (physician) and Advanced Practice Providers or APPs (Physician Assistants and Nurse Practitioners) who all work together to provide you with the care you need, when you need it.  Your next appointment:   6 week(s)  Provider:   Alean Kobus, MD    We recommend signing up for the patient portal called MyChart.  Sign up information is provided on this After Visit Summary.  MyChart is used to connect with patients for Virtual Visits (Telemedicine).  Patients are able to view lab/test results, encounter notes, upcoming appointments, etc.  Non-urgent messages can be sent to your provider as well.   To learn more about what you can do with MyChart, go to [email or web address].   Other Instructions None

## 2024-03-22 NOTE — Assessment & Plan Note (Signed)
 lipid panel 11/01/2023 total cholesterol 136, HDL 38, LDL 59 and triglycerides 246. Hemoglobin A1c 7.4.  Elevated triglycerides. Continue with dietary modifications and aggressive control of her diabetes which is likely contributing to elevated triglycerides.  Continue atorvastatin  20 mg once daily.

## 2024-03-22 NOTE — Assessment & Plan Note (Addendum)
 Improved blood pressure compared to recent ER visit.   Blood pressure below 130/80 mmHg. Continue amlodipine  10 mg once daily Continue clonidine  which was recently started she is taking 0.2 mg twice daily. Taking metoprolol  tartrate 100 mg twice a day Olmesartan  40 mg once daily. Continues hydrochlorothiazide  listed in her chart but she is not taking this at home.  Will remove this from her list.  - Continue current medications. If continues to remain suboptimal will switch metoprolol  to carvedilol.  Obtain transthoracic echocardiogram for cardiac structure and function assessment.

## 2024-03-22 NOTE — Assessment & Plan Note (Signed)
 Chronic symptoms but more recently worse for the past couple weeks.  Associated with night sweats Nocturnal symptoms. Likely related to hiatal hernia reflux symptoms but cannot exclude cardiac arrhythmias.  Will obtain Zio patch for 14 days.

## 2024-03-23 ENCOUNTER — Other Ambulatory Visit

## 2024-03-23 ENCOUNTER — Ambulatory Visit: Payer: Self-pay | Admitting: Physician Assistant

## 2024-03-23 DIAGNOSIS — R899 Unspecified abnormal finding in specimens from other organs, systems and tissues: Secondary | ICD-10-CM

## 2024-03-23 DIAGNOSIS — I1A0 Resistant hypertension: Secondary | ICD-10-CM

## 2024-03-23 LAB — FE+CBC/D/PLT+TIBC+FER+RETIC
Basophils Absolute: 0.1 x10E3/uL (ref 0.0–0.2)
Basos: 1 %
EOS (ABSOLUTE): 0.1 x10E3/uL (ref 0.0–0.4)
Eos: 1 %
Ferritin: 12 ng/mL — ABNORMAL LOW (ref 15–150)
Hematocrit: 39.1 % (ref 34.0–46.6)
Hemoglobin: 12.2 g/dL (ref 11.1–15.9)
Immature Grans (Abs): 0 x10E3/uL (ref 0.0–0.1)
Immature Granulocytes: 0 %
Iron Saturation: 5 % — CL (ref 15–55)
Iron: 20 ug/dL — ABNORMAL LOW (ref 27–159)
Lymphocytes Absolute: 2.4 x10E3/uL (ref 0.7–3.1)
Lymphs: 33 %
MCH: 24.7 pg — ABNORMAL LOW (ref 26.6–33.0)
MCHC: 31.2 g/dL — ABNORMAL LOW (ref 31.5–35.7)
MCV: 79 fL (ref 79–97)
Monocytes Absolute: 0.9 x10E3/uL (ref 0.1–0.9)
Monocytes: 12 %
Neutrophils Absolute: 3.7 x10E3/uL (ref 1.4–7.0)
Neutrophils: 53 %
Platelets: 345 x10E3/uL (ref 150–450)
RBC: 4.93 x10E6/uL (ref 3.77–5.28)
RDW: 15.1 % (ref 11.7–15.4)
Retic Ct Pct: 1.8 % (ref 0.6–2.6)
Total Iron Binding Capacity: 402 ug/dL (ref 250–450)
UIBC: 382 ug/dL (ref 131–425)
WBC: 7.1 x10E3/uL (ref 3.4–10.8)

## 2024-03-23 LAB — COMPREHENSIVE METABOLIC PANEL WITH GFR
ALT: 32 IU/L (ref 0–32)
AST: 30 IU/L (ref 0–40)
Albumin: 4.4 g/dL (ref 3.9–4.9)
Alkaline Phosphatase: 117 IU/L — ABNORMAL HIGH (ref 41–116)
BUN/Creatinine Ratio: 16 (ref 9–23)
BUN: 14 mg/dL (ref 6–24)
Bilirubin Total: 0.3 mg/dL (ref 0.0–1.2)
CO2: 21 mmol/L (ref 20–29)
Calcium: 9.9 mg/dL (ref 8.7–10.2)
Chloride: 100 mmol/L (ref 96–106)
Creatinine, Ser: 0.87 mg/dL (ref 0.57–1.00)
Globulin, Total: 2.4 g/dL (ref 1.5–4.5)
Glucose: 209 mg/dL — ABNORMAL HIGH (ref 70–99)
Potassium: 5.2 mmol/L (ref 3.5–5.2)
Sodium: 137 mmol/L (ref 134–144)
Total Protein: 6.8 g/dL (ref 6.0–8.5)
eGFR: 81 mL/min/1.73

## 2024-03-27 ENCOUNTER — Encounter (HOSPITAL_BASED_OUTPATIENT_CLINIC_OR_DEPARTMENT_OTHER): Payer: Self-pay

## 2024-03-28 LAB — METANEPHRINES, PHEOCHROMOCYT
Creatinine, Random U: 106.5 mg/dL
Metaneph Total, Ur: 103 ug/L
Metaneph/Creat Ratio: 0.4 ug/mg{creat} (ref 0.0–1.0)
Normetanephrine, Ur: 250 ug/L

## 2024-03-29 ENCOUNTER — Other Ambulatory Visit: Payer: Self-pay | Admitting: Physician Assistant

## 2024-03-29 DIAGNOSIS — E1165 Type 2 diabetes mellitus with hyperglycemia: Secondary | ICD-10-CM

## 2024-04-03 ENCOUNTER — Ambulatory Visit: Admitting: Physician Assistant

## 2024-04-09 ENCOUNTER — Other Ambulatory Visit (HOSPITAL_BASED_OUTPATIENT_CLINIC_OR_DEPARTMENT_OTHER): Admitting: Radiology

## 2024-04-13 ENCOUNTER — Other Ambulatory Visit: Payer: Self-pay | Admitting: Physician Assistant

## 2024-04-13 ENCOUNTER — Encounter: Payer: Self-pay | Admitting: Physician Assistant

## 2024-04-13 DIAGNOSIS — K219 Gastro-esophageal reflux disease without esophagitis: Secondary | ICD-10-CM

## 2024-04-13 DIAGNOSIS — E1165 Type 2 diabetes mellitus with hyperglycemia: Secondary | ICD-10-CM

## 2024-04-13 MED ORDER — RYBELSUS 14 MG PO TABS: 1.0000 | ORAL_TABLET | Freq: Every day | ORAL | 3 refills | Status: AC

## 2024-04-16 ENCOUNTER — Other Ambulatory Visit (HOSPITAL_BASED_OUTPATIENT_CLINIC_OR_DEPARTMENT_OTHER): Admitting: Radiology

## 2024-04-17 ENCOUNTER — Ambulatory Visit: Admitting: Physician Assistant

## 2024-04-19 ENCOUNTER — Ambulatory Visit

## 2024-05-02 ENCOUNTER — Ambulatory Visit
